# Patient Record
Sex: Male | Born: 1955
Health system: Southern US, Community
[De-identification: ages and names within clinical notes are randomized; demographics above are authoritative.]

## PROBLEM LIST (undated history)

## (undated) DIAGNOSIS — C61 Malignant neoplasm of prostate: Secondary | ICD-10-CM

## (undated) HISTORY — DX: Malignant neoplasm of prostate: C61

---

## 2001-03-23 ENCOUNTER — Ambulatory Visit (HOSPITAL_COMMUNITY): Admission: RE | Admit: 2001-03-23 | Discharge: 2001-03-23 | Payer: Self-pay | Admitting: Gastroenterology

## 2014-12-04 ENCOUNTER — Ambulatory Visit
Admission: RE | Admit: 2014-12-04 | Discharge: 2014-12-04 | Disposition: A | Discharge status: Home / Self Care | Payer: Self-pay | Source: Ambulatory Visit | Attending: *Deleted | Admitting: *Deleted

## 2014-12-04 ENCOUNTER — Other Ambulatory Visit: Payer: Self-pay | Admitting: *Deleted

## 2014-12-04 DIAGNOSIS — M545 Low back pain: Secondary | ICD-10-CM

## 2014-12-04 DIAGNOSIS — G8929 Other chronic pain: Secondary | ICD-10-CM

## 2014-12-04 DIAGNOSIS — M79605 Pain in left leg: Secondary | ICD-10-CM

## 2014-12-04 DIAGNOSIS — M533 Sacrococcygeal disorders, not elsewhere classified: Secondary | ICD-10-CM

## 2014-12-14 ENCOUNTER — Ambulatory Visit
Admission: RE | Admit: 2014-12-14 | Discharge: 2014-12-14 | Disposition: A | Discharge status: Home / Self Care | Payer: BLUE CROSS/BLUE SHIELD | Source: Ambulatory Visit | Attending: *Deleted | Admitting: *Deleted

## 2014-12-14 DIAGNOSIS — M79605 Pain in left leg: Secondary | ICD-10-CM

## 2014-12-14 DIAGNOSIS — M545 Low back pain: Secondary | ICD-10-CM

## 2014-12-14 DIAGNOSIS — G8929 Other chronic pain: Secondary | ICD-10-CM

## 2014-12-14 DIAGNOSIS — M533 Sacrococcygeal disorders, not elsewhere classified: Secondary | ICD-10-CM

## 2014-12-14 DIAGNOSIS — M79604 Pain in right leg: Secondary | ICD-10-CM

## 2014-12-14 MED ORDER — GADOBENATE DIMEGLUMINE 529 MG/ML IV SOLN
20.0000 mL | Freq: Once | INTRAVENOUS | Status: AC | PRN
  Administered 2014-12-14: 20 mL via INTRAVENOUS

## 2016-07-15 DIAGNOSIS — Z1211 Encounter for screening for malignant neoplasm of colon: Secondary | ICD-10-CM | POA: Diagnosis not present

## 2016-07-15 DIAGNOSIS — K219 Gastro-esophageal reflux disease without esophagitis: Secondary | ICD-10-CM | POA: Diagnosis not present

## 2016-08-13 DIAGNOSIS — K635 Polyp of colon: Secondary | ICD-10-CM | POA: Diagnosis not present

## 2016-08-13 DIAGNOSIS — K5730 Diverticulosis of large intestine without perforation or abscess without bleeding: Secondary | ICD-10-CM | POA: Diagnosis not present

## 2016-08-13 DIAGNOSIS — Z1211 Encounter for screening for malignant neoplasm of colon: Secondary | ICD-10-CM | POA: Diagnosis not present

## 2016-08-13 DIAGNOSIS — D125 Benign neoplasm of sigmoid colon: Secondary | ICD-10-CM | POA: Diagnosis not present

## 2016-10-20 ENCOUNTER — Other Ambulatory Visit: Payer: Self-pay | Admitting: Urology

## 2016-10-20 DIAGNOSIS — C61 Malignant neoplasm of prostate: Secondary | ICD-10-CM

## 2016-11-06 ENCOUNTER — Encounter (HOSPITAL_COMMUNITY)
Admission: RE | Admit: 2016-11-06 | Discharge: 2016-11-06 | Disposition: A | Discharge status: Home / Self Care | Payer: BLUE CROSS/BLUE SHIELD | Source: Ambulatory Visit | Attending: Urology | Admitting: Urology

## 2016-11-06 DIAGNOSIS — C61 Malignant neoplasm of prostate: Secondary | ICD-10-CM | POA: Insufficient documentation

## 2016-11-06 MED ORDER — AXUMIN (FLUCICLOVINE F 18) INJECTION
9.0200 | Freq: Once | INTRAVENOUS | Status: AC
  Administered 2016-11-06: 9.02 via INTRAVENOUS

## 2017-03-13 ENCOUNTER — Other Ambulatory Visit: Payer: Self-pay | Admitting: Otolaryngology

## 2017-03-13 DIAGNOSIS — J3800 Paralysis of vocal cords and larynx, unspecified: Secondary | ICD-10-CM

## 2017-03-13 DIAGNOSIS — H9041 Sensorineural hearing loss, unilateral, right ear, with unrestricted hearing on the contralateral side: Secondary | ICD-10-CM

## 2017-03-13 DIAGNOSIS — R490 Dysphonia: Secondary | ICD-10-CM

## 2017-03-13 DIAGNOSIS — C61 Malignant neoplasm of prostate: Secondary | ICD-10-CM

## 2017-08-11 ENCOUNTER — Ambulatory Visit
Admission: RE | Admit: 2017-08-11 | Discharge: 2017-08-11 | Disposition: A | Discharge status: Home / Self Care | Payer: BLUE CROSS/BLUE SHIELD | Source: Ambulatory Visit | Attending: Family Medicine | Admitting: Family Medicine

## 2017-08-11 ENCOUNTER — Other Ambulatory Visit: Payer: Self-pay | Admitting: Family Medicine

## 2017-08-11 DIAGNOSIS — R053 Chronic cough: Secondary | ICD-10-CM

## 2017-08-11 DIAGNOSIS — R05 Cough: Secondary | ICD-10-CM

## 2017-09-03 ENCOUNTER — Telehealth: Payer: Self-pay | Admitting: Pulmonary Disease

## 2017-09-03 ENCOUNTER — Ambulatory Visit: Payer: BLUE CROSS/BLUE SHIELD | Admitting: Pulmonary Disease

## 2017-09-03 ENCOUNTER — Encounter: Payer: Self-pay | Admitting: Pulmonary Disease

## 2017-09-03 VITALS — BP 142/78 | HR 74

## 2017-09-03 DIAGNOSIS — R05 Cough: Secondary | ICD-10-CM

## 2017-09-03 DIAGNOSIS — R059 Cough, unspecified: Secondary | ICD-10-CM

## 2017-09-03 LAB — NITRIC OXIDE: NITRIC OXIDE: 17

## 2017-09-03 MED ORDER — CHLORPHENIRAMINE MALEATE 4 MG PO TABS: 8.0000 mg | ORAL_TABLET | Freq: Three times a day (TID) | ORAL | 0 refills | Status: DC

## 2017-09-03 MED ORDER — AZELASTINE HCL 0.1 % NA SOLN: 1.0000 | Freq: Two times a day (BID) | NASAL | 6 refills | Status: DC

## 2017-09-03 MED ORDER — PANTOPRAZOLE SODIUM 40 MG PO TBEC: 40.0000 mg | DELAYED_RELEASE_TABLET | Freq: Every day | ORAL | 1 refills | Status: DC

## 2017-09-03 MED ORDER — FLUTICASONE PROPIONATE 50 MCG/ACT NA SUSP: 2.0000 | Freq: Every day | NASAL | 6 refills | Status: DC

## 2017-09-03 MED ORDER — AZELASTINE-FLUTICASONE 137-50 MCG/ACT NA SUSP: 2.0000 | Freq: Every day | NASAL | 5 refills | Status: DC

## 2017-09-03 NOTE — Telephone Encounter (Signed)
States patient gets meds by delivery and would like to have this done before lunch.

## 2017-09-03 NOTE — Progress Notes (Signed)
Matthew Rangel    [id number]    [birth year]  Primary Care Physician:Hamrick, Lorin Mercy, MD  Referring Physician: Leonides Sake, MD [address] [place], [place] [address]  Chief complaint: Consult for evaluation of cough  HPI: 61 year old with history of prostate cancer.  Here for evaluation of chronic cough.  He reports that the cough for more than therapy 6 months ago when he started [place] therapy for prostate cancer.  He also had a voice change at that time.  He was evaluated by Dr. Constance Holster, ENT who noted a new onset left vocal cord paralysis.  CT imaging of the neck was recommended but did not get done. He continues to have cough which is nonproductive in nature, occasional dyspnea with wheezing.  Denies any mucus production, fevers, chills. He has seasonal allergies with acid reflux.  He uses steroid nasal spray and Protonix as needed   Pets: None currently.  He used to have a dog. 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 Exposures: Not recall exposure to asbestos, mold or any other exposures Smoking history: Never smoker Travel History: Not significant  Outpatient Encounter Medications as of 09/03/2017  Medication Sig  . abiraterone acetate (ZYTIGA) 250 MG tablet Take 1000 mg (4 X 250mg  tablets) by mouth daily.  Take 1 hour before or 2 hours after meals.  Marland Kitchen aspirin EC 81 MG tablet Take 81 mg by mouth daily.  Marland Kitchen levothyroxine (SYNTHROID, LEVOTHROID) 125 MCG tablet Take by mouth.   No facility-administered encounter medications on file as of 09/03/2017.     Allergies as of 09/03/2017  . (Not on File)    Past Medical History:  Diagnosis Date  . Prostate cancer [facility])     History reviewed. No pertinent surgical history.  Family History  Problem Relation Age of Onset  . Breast cancer Mother   . Heart attack Father     Social History   Socioeconomic History  . Marital status: Married    Spouse name: Not on  file  . Number of children: Not on file  . Years of education: Not on file  . Highest education level: Not on file  Social Needs  . Financial resource strain: Not on file  . Food insecurity - worry: Not on file  . Food insecurity - inability: Not on file  . Transportation needs - medical: Not on file  . Transportation needs - non-medical: Not on file  Occupational History  . Not on file  Tobacco Use  . Smoking status: Never Smoker  . Smokeless tobacco: Never Used  Substance and Sexual Activity  . Alcohol use: Yes    Frequency: Never    Comment: occ  . Drug use: No  . Sexual activity: Not on file  Other Topics Concern  . Not on file  Social History Narrative  . Not on file    Review of systems: Review of Systems  Constitutional: Negative for fever and chills.  HENT: Negative.   Eyes: Negative for blurred vision.  Respiratory: as per HPI  Cardiovascular: Negative for chest pain and palpitations.  Gastrointestinal: Negative for vomiting, diarrhea, blood per rectum. Genitourinary: Negative for dysuria, urgency, frequency and hematuria.  Musculoskeletal: Negative for myalgias, back pain and joint pain.  Skin: Negative for itching and rash.  Neurological: Negative for dizziness, tremors, focal weakness, seizures and loss of consciousness.  Endo/Heme/Allergies: Negative for environmental allergies.  Psychiatric/Behavioral: Negative for  depression, suicidal ideas and hallucinations.  All other systems reviewed and are negative.  Physical Exam: Blood pressure (!) 142/78, pulse 74, SpO2 97 %. Gen:      No acute distress HEENT:  EOMI, sclera anicteric Neck:     No masses; no thyromegaly Lungs:    Clear to auscultation bilaterally; normal respiratory effort CV:         Regular rate and rhythm; no murmurs Abd:      + bowel sounds; soft, non-tender; no palpable masses, no distension Ext:    No edema; adequate peripheral perfusion Skin:      Warm and dry; no rash Neuro: alert  and oriented x 3 Psych: normal mood and affect  Data Reviewed: Chest x-ray 08/11/17-no active pulmonary disease. FENO 09/03/17-17  Assessment:  Evaluation for chronic cough Suspect upper airway cough from postnasal drip.  He may have a component of acid reflux which is contributing to ongoing cough.  Will start him on chlorpheniramine and Dymista nasal spray He has history of GERD but is using the PPI intermittently. Suggests that he use Protonix daily for treatment of acid reflux Suspicion for asthma is low.  We will schedule for pulmonary function test and give albuterol rescue inhaler  Previous ENT eval noted for vocal cord paralysis.  CT scan neck was recommended but not done yet We will refer him back to Dr. Constance Holster for reevaluation.  Plan/Recommendations: - Start chlorpheniramine, Dymista - Protonix for GERD - PFTs, albuterol rescue inhaler - Refer back to ENT.  Marshell Garfinkel MD [facility] Pager [phone] 09/03/2017, 10:00 AM  CC: Hamrick, Lorin Mercy, MD

## 2017-09-03 NOTE — Telephone Encounter (Signed)
Changed to astelin and flonase since the insurance wanted to change the pt to a generic.  Nothing further is needed.

## 2017-09-03 NOTE — Patient Instructions (Signed)
We will start you on chlorpheniramine antihistamine 8 mg 3 times daily and Dymista nasal spray Start using the Protonix every day for treatment of acid reflux We will schedule you for pulmonary function test and give an albuterol rescue inhaler We will refer you to Dr. Constance Holster for reevaluation of your cough Follow-up in 1-2 months.

## 2017-09-07 ENCOUNTER — Other Ambulatory Visit: Payer: Self-pay | Admitting: Urology

## 2017-09-07 DIAGNOSIS — Z79899 Other long term (current) drug therapy: Secondary | ICD-10-CM

## 2017-09-23 ENCOUNTER — Ambulatory Visit
Admission: RE | Admit: 2017-09-23 | Discharge: 2017-09-23 | Disposition: A | Discharge status: Home / Self Care | Payer: BLUE CROSS/BLUE SHIELD | Source: Ambulatory Visit | Attending: Urology | Admitting: Urology

## 2017-09-23 DIAGNOSIS — Z79899 Other long term (current) drug therapy: Secondary | ICD-10-CM

## 2017-10-15 ENCOUNTER — Ambulatory Visit (INDEPENDENT_AMBULATORY_CARE_PROVIDER_SITE_OTHER): Payer: BLUE CROSS/BLUE SHIELD | Admitting: Pulmonary Disease

## 2017-10-15 ENCOUNTER — Encounter: Payer: Self-pay | Admitting: Pulmonary Disease

## 2017-10-15 ENCOUNTER — Other Ambulatory Visit (INDEPENDENT_AMBULATORY_CARE_PROVIDER_SITE_OTHER): Payer: BLUE CROSS/BLUE SHIELD

## 2017-10-15 ENCOUNTER — Ambulatory Visit: Payer: BLUE CROSS/BLUE SHIELD | Admitting: Pulmonary Disease

## 2017-10-15 VITALS — BP 140/82 | HR 73 | Ht 73.0 in | Wt 255.0 lb

## 2017-10-15 DIAGNOSIS — R05 Cough: Secondary | ICD-10-CM | POA: Diagnosis not present

## 2017-10-15 DIAGNOSIS — J3800 Paralysis of vocal cords and larynx, unspecified: Secondary | ICD-10-CM

## 2017-10-15 DIAGNOSIS — R059 Cough, unspecified: Secondary | ICD-10-CM

## 2017-10-15 LAB — PULMONARY FUNCTION TEST
DL/VA % PRED: 114 %
DL/VA: 5.47 ml/min/mmHg/L
DLCO UNC % PRED: 94 %
DLCO cor % pred: 99 %
DLCO cor: 36.33 ml/min/mmHg
DLCO unc: 34.46 ml/min/mmHg
FEF 25-75 PRE: 2.84 L/s
FEF 25-75 Post: 3.2 L/sec
FEF2575-%CHANGE-POST: 12 %
FEF2575-%Pred-Post: 104 %
FEF2575-%Pred-Pre: 92 %
FEV1-%Change-Post: 3 %
FEV1-%PRED-PRE: 95 %
FEV1-%Pred-Post: 98 %
FEV1-PRE: 3.26 L
FEV1-Post: 3.38 L
FEV1FVC-%CHANGE-POST: 0 %
FEV1FVC-%Pred-Pre: 99 %
FEV6-%CHANGE-POST: 6 %
FEV6-%PRED-POST: 103 %
FEV6-%PRED-PRE: 97 %
FEV6-PRE: 4.14 L
FEV6-Post: 4.41 L
FEV6FVC-%Change-Post: 0 %
FEV6FVC-%PRED-PRE: 103 %
FEV6FVC-%Pred-Post: 104 %
FVC-%CHANGE-POST: 4 %
FVC-%Pred-Post: 99 %
FVC-%Pred-Pre: 95 %
FVC-Post: 4.41 L
FVC-Pre: 4.22 L
POST FEV6/FVC RATIO: 100 %
Post FEV1/FVC ratio: 77 %
Pre FEV1/FVC ratio: 77 %
Pre FEV6/FVC Ratio: 100 %
RV % PRED: 98 %
RV: 2.43 L
TLC % pred: 93 %
TLC: 7.14 L

## 2017-10-15 LAB — BASIC METABOLIC PANEL
BUN: 19 mg/dL (ref 6–23)
CALCIUM: 8.9 mg/dL (ref 8.4–10.5)
CO2: 29 meq/L (ref 19–32)
Chloride: 103 mEq/L (ref 96–112)
Creatinine, Ser: 1 mg/dL (ref 0.40–1.50)
GFR: 96.96 mL/min (ref >60.00)
GLUCOSE: 99 mg/dL (ref 70–99)
Potassium: 4 mEq/L (ref 3.5–5.1)
SODIUM: 138 meq/L (ref 135–145)

## 2017-10-15 MED ORDER — ALBUTEROL SULFATE HFA 108 (90 BASE) MCG/ACT IN AERS: 2.0000 | INHALATION_SPRAY | Freq: Four times a day (QID) | RESPIRATORY_TRACT | 5 refills | Status: DC | PRN

## 2017-10-15 NOTE — Progress Notes (Signed)
Matthew Rangel    [id number]    [birth year]  Primary Care Physician:Hamrick, Lorin Mercy, MD  Referring Physician: Leonides Sake, MD [place], [place] [address]  Chief complaint: Follow-up for chronic cough  HPI: 62 year old with history of prostate cancer.  Here for evaluation of chronic cough.  He reports that the cough for more than therapy 6 months ago when he started [place] therapy for prostate cancer.  He also had a voice change at that time.  He was evaluated by Dr. Constance Holster, ENT who noted a new onset left vocal cord paralysis.  CT imaging of the neck was recommended but did not get done. He continues to have cough which is nonproductive in nature, occasional dyspnea with wheezing.  Denies any mucus production, fevers, chills. He has seasonal allergies with acid reflux.  He uses steroid nasal spray and Protonix as needed   Pets: None currently.  He used to have a dog. 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 Exposures: Not recall exposure to asbestos, mold or any other exposures Smoking history: Never smoker Travel History: Not significant  Interim history: Continues to have nonproductive cough.  Has tried chlorpheniramine, Dymista nasal spray and Protonix with no improvement in symptoms He has PFTs today and is here for review. Still has not followed up with ENT even though he had an appointment. Reports that he has history of sleep apnea and has been prescribed CPAP but is not using it on a regular basis.  Outpatient Encounter Medications as of 10/15/2017  Medication Sig  . abiraterone acetate (ZYTIGA) 250 MG tablet Take 1000 mg (4 X 250mg  tablets) by mouth daily.  Take 1 hour before or 2 hours after meals.  Marland Kitchen aspirin EC 81 MG tablet Take 81 mg by mouth daily.  Marland Kitchen azelastine (ASTELIN) 0.1 % nasal spray Place 1 spray into both nostrils 2 (two) times daily. Use in each nostril as directed  .  Azelastine-Fluticasone (DYMISTA) 137-50 MCG/ACT SUSP Place 2 sprays into the nose daily.  . chlorpheniramine (CHLOR-TRIMETON) 4 MG tablet Take 2 tablets (8 mg total) by mouth 3 (three) times daily.  . fluticasone (FLONASE) 50 MCG/ACT nasal spray Place 2 sprays into both nostrils daily.  Marland Kitchen goserelin (ZOLADEX) 3.6 MG injection Inject 3.6 mg into the skin every 28 (twenty-eight) days.  Marland Kitchen levothyroxine (SYNTHROID, LEVOTHROID) 125 MCG tablet Take by mouth.  . pantoprazole (PROTONIX) 40 MG tablet Take 1 tablet (40 mg total) by mouth daily.   No facility-administered encounter medications on file as of 10/15/2017.     Allergies as of 10/15/2017  . (Not on File)    Past Medical History:  Diagnosis Date  . Prostate cancer ([facility])     No past surgical history on file.  Family History  Problem Relation Age of Onset  . Breast cancer Mother   . Heart attack Father     Social History   Socioeconomic History  . Marital status: Married    Spouse name: Not on file  . Number of children: Not on file  . Years of education: Not on file  . Highest education level: Not on file  Social Needs  . Financial resource strain: Not on file  . Food insecurity - worry: Not on file  . Food insecurity - inability: Not on file  . Transportation needs - medical: Not on file  . Transportation needs - non-medical: Not on  file  Occupational History  . Not on file  Tobacco Use  . Smoking status: Never Smoker  . Smokeless tobacco: Never Used  Substance and Sexual Activity  . Alcohol use: Yes    Frequency: Never    Comment: occ  . Drug use: No  . Sexual activity: Not on file  Other Topics Concern  . Not on file  Social History Narrative  . Not on file   Review of systems: Review of Systems  Constitutional: Negative for fever and chills.  HENT: Negative.   Eyes: Negative for blurred vision.  Respiratory: as per HPI  Cardiovascular: Negative for chest pain and palpitations.  Gastrointestinal:  Negative for vomiting, diarrhea, blood per rectum. Genitourinary: Negative for dysuria, urgency, frequency and hematuria.  Musculoskeletal: Negative for myalgias, back pain and joint pain.  Skin: Negative for itching and rash.  Neurological: Negative for dizziness, tremors, focal weakness, seizures and loss of consciousness.  Endo/Heme/Allergies: Negative for environmental allergies.  Psychiatric/Behavioral: Negative for depression, suicidal ideas and hallucinations.  All other systems reviewed and are negative.  Physical Exam: Blood pressure 140/82, pulse 73, height 6\' 1"  (1.854 m), weight 255 lb (115.7 kg), SpO2 97 %. Gen:      No acute distress HEENT:  EOMI, sclera anicteric Neck:     No masses; no thyromegaly Lungs:    Clear to auscultation bilaterally; normal respiratory effort CV:         Regular rate and rhythm; no murmurs Abd:      + bowel sounds; soft, non-tender; no palpable masses, no distension Ext:    No edema; adequate peripheral perfusion Skin:      Warm and dry; no rash Neuro: alert and oriented x 3 Psych: normal mood and affect  Data Reviewed: Chest x-ray 08/11/17-no active pulmonary disease. FENO 09/03/17-17  PFTs 10/15/17 FVC 4.41 [9 9%], FEV1 3.38 [98%], F/F 77, TLC 93%, DLCO 94% Normal test  Assessment:  Evaluation for chronic cough Suspect upper airway cough from postnasal drip.  He may have a component of acid reflux which is contributing to ongoing cough.  Currently on Chlorpheniramine, Dymista and PPI with persistent symptoms Previous ENT eval noted for vocal cord paralysis.  CT scan neck was recommended but not done yet We will refer him back to Dr. Constance Holster, ENT for reevaluation.  Suspicion for asthma is low but we will give him albuterol rescue inhaler and assess response to therapy. Order CT of the chest to make sure there is no lung involvement from his prostate cancer.   OSA Noncompliant with therapy.  Untreated OSA can contribute to ongoing  cough I have encouraged him to use his CPAP every day.  Plan/Recommendations: - Continue chlorpheniramine, Dymista, Protonix - Albuterol rescue inhaler - Refer back to ENT. - CT chest.   Marshell Garfinkel MD [facility] Pager [phone] 10/15/2017, 12:27 PM  CC: Hamrick, Lorin Mercy, MD

## 2017-10-15 NOTE — Patient Instructions (Addendum)
We will order a CT neck/thyroid with contrast as per recommendation of ENT doctor I will add a CT of the chest to make sure there is no lung abnormality Will refer you back to Dr. Constance Holster, ENT for evaluation We will give you albuterol inhaler to be used as needed Please start using your CPAP on a regular basis Follow-up in 3 months.

## 2017-10-15 NOTE — Addendum Note (Signed)
Addended by: Parke Poisson E on: 10/15/2017 12:56 PM   Modules accepted: Orders

## 2017-10-15 NOTE — Progress Notes (Signed)
Full PFT completed today 10/15/17

## 2017-10-21 ENCOUNTER — Other Ambulatory Visit: Payer: Self-pay

## 2017-10-21 DIAGNOSIS — R059 Cough, unspecified: Secondary | ICD-10-CM

## 2017-10-21 DIAGNOSIS — R05 Cough: Secondary | ICD-10-CM

## 2017-10-22 ENCOUNTER — Ambulatory Visit (INDEPENDENT_AMBULATORY_CARE_PROVIDER_SITE_OTHER)
Admission: RE | Admit: 2017-10-22 | Discharge: 2017-10-22 | Disposition: A | Discharge status: Home / Self Care | Payer: BLUE CROSS/BLUE SHIELD | Source: Ambulatory Visit | Attending: Pulmonary Disease | Admitting: Pulmonary Disease

## 2017-10-22 DIAGNOSIS — R059 Cough, unspecified: Secondary | ICD-10-CM

## 2017-10-22 DIAGNOSIS — J3800 Paralysis of vocal cords and larynx, unspecified: Secondary | ICD-10-CM

## 2017-10-22 DIAGNOSIS — R05 Cough: Secondary | ICD-10-CM

## 2017-10-22 MED ORDER — IOPAMIDOL (ISOVUE-300) INJECTION 61%
80.0000 mL | Freq: Once | INTRAVENOUS | Status: AC | PRN
  Administered 2017-10-22: 80 mL via INTRAVENOUS

## 2018-02-12 ENCOUNTER — Other Ambulatory Visit: Payer: Self-pay | Admitting: Urology

## 2018-02-12 DIAGNOSIS — C61 Malignant neoplasm of prostate: Secondary | ICD-10-CM

## 2018-02-17 ENCOUNTER — Telehealth: Payer: Self-pay | Admitting: Oncology

## 2018-02-17 ENCOUNTER — Encounter: Payer: Self-pay | Admitting: Oncology

## 2018-02-17 NOTE — Telephone Encounter (Signed)
Referral received from Dr. Jeffie Pollock at [facility] for a dx of castrate resistant prostate cancer. Pt has been scheduled to see Dr. Alen Blew on 6/7 at 2pm. Pt aware to arrive 30 minutes early. Letter mailed to the pt and faxed to the referring office.

## 2018-02-24 ENCOUNTER — Encounter (HOSPITAL_COMMUNITY)
Admission: RE | Admit: 2018-02-24 | Discharge: 2018-02-24 | Disposition: A | Discharge status: Home / Self Care | Payer: BLUE CROSS/BLUE SHIELD | Source: Ambulatory Visit | Attending: Urology | Admitting: Urology

## 2018-02-24 DIAGNOSIS — C61 Malignant neoplasm of prostate: Secondary | ICD-10-CM | POA: Insufficient documentation

## 2018-02-24 DIAGNOSIS — M899 Disorder of bone, unspecified: Secondary | ICD-10-CM | POA: Diagnosis not present

## 2018-02-24 MED ORDER — TECHNETIUM TC 99M MEDRONATE IV KIT
20.6000 | PACK | Freq: Once | INTRAVENOUS | Status: AC | PRN
  Administered 2018-02-24: 20.6 via INTRAVENOUS

## 2018-02-26 ENCOUNTER — Inpatient Hospital Stay: Payer: BLUE CROSS/BLUE SHIELD | Attending: Oncology | Admitting: Oncology

## 2018-02-26 ENCOUNTER — Telehealth: Payer: Self-pay | Admitting: Oncology

## 2018-02-26 VITALS — BP 145/89 | HR 68 | Temp 98.4°F | Resp 17 | Ht 73.0 in | Wt 253.6 lb

## 2018-02-26 DIAGNOSIS — C7951 Secondary malignant neoplasm of bone: Secondary | ICD-10-CM | POA: Diagnosis not present

## 2018-02-26 DIAGNOSIS — C61 Malignant neoplasm of prostate: Secondary | ICD-10-CM | POA: Insufficient documentation

## 2018-02-26 DIAGNOSIS — E291 Testicular hypofunction: Secondary | ICD-10-CM | POA: Diagnosis not present

## 2018-02-26 NOTE — Telephone Encounter (Signed)
Appointments scheduled AVS/Calendar printed per 6/7 los

## 2018-02-26 NOTE — Progress Notes (Signed)
Reason for Referral: Prostate cancer  HPI: 62 year old gentleman native of [place] but currently resides in this area and have done so for an extended period of time.  He has a history of prostate cancer diagnosed in 2015.  At that time his PSA was 8.42 and a biopsy performed at that time showed a Gleason score of 3+4 = 7 on March 08, 2014.  He received Lupron for short period of time before proceeding with cryoablation in 2016.  His PSA did not respond with a rise up to 22.1 in January 2018.  Axumin scan in February 2018 showed periaortic lymph node involvement at that time.  He subsequently started on Zoladex which she has been receiving under the care of Dr. Jeffie Pollock since that time.  His a PSA in January 2018 was 18.1 and decline to a nadir of 8.1 in October 2018.  His PSA was up to 9.9 in January 2019 and up to 21 in May 2019.  He was late on his recent [place] injection.  He remains a relatively asymptomatic from his prostate cancer but does report some complications related to [facility] including hot flashes, weight gain and fatigue.  He does report urinary frequency and urgency and nocturia.  He denies any bone pain or pathological fractures.  His most recent staging work-up completed in June 2019 including a bone scan on February 24, 2018 which showed abnormal uptake in the posterior left 10th rib as well as the medial right ninth rib corresponding sclerotic lesions and CT which is consistent with metastatic disease.  His CT scan of the abdomen and pelvis did show periaortic lymphadenopathy measuring 2.8 cm.  He does not report any headaches, blurry vision, syncope or seizures. Does not report any fevers, chills but does report sweats at times. Does not report any cough, wheezing or hemoptysis.  Does not report any chest pain, palpitation, orthopnea or leg edema.  Does not report any nausea, vomiting or abdominal pain.  Does not report any constipation or diarrhea.  Does not report any skeletal  complaints.    Does not report hematuria.  Does not report any skin rashes or lesions. Does not report any heat or cold intolerance.  Does not report any lymphadenopathy or petechiae.  Does not report any anxiety or depression.  Remaining review of systems is negative.    Past Medical History:  Diagnosis Date  . Prostate cancer ([place])   :  No past surgical history on file.:   Current Outpatient Medications:  .  aspirin EC 81 MG tablet, Take 81 mg by mouth daily., Disp: , Rfl:  .  goserelin (ZOLADEX) 3.6 MG injection, Inject 3.6 mg into the skin every 28 (twenty-eight) days., Disp: , Rfl:  .  levothyroxine (SYNTHROID, LEVOTHROID) 125 MCG tablet, Take by mouth., Disp: , Rfl: :  No Known Allergies:  Family History  Problem Relation Age of Onset  . Breast cancer Mother   . Heart attack Father   :  Social History   Socioeconomic History  . Marital status: Married    Spouse name: Not on file  . Number of children: Not on file  . Years of education: Not on file  . Highest education level: Not on file  Occupational History  . Not on file  Social Needs  . Financial resource strain: Not on file  . Food insecurity:    Worry: Not on file    Inability: Not on file  . Transportation needs:    Medical: Not  on file    Non-medical: Not on file  Tobacco Use  . Smoking status: Never Smoker  . Smokeless tobacco: Never Used  Substance and Sexual Activity  . Alcohol use: Yes    Frequency: Never    Comment: occ  . Drug use: No  . Sexual activity: Not on file  Lifestyle  . Physical activity:    Days per week: Not on file    Minutes per session: Not on file  . Stress: Not on file  Relationships  . Social connections:    Talks on phone: Not on file    Gets together: Not on file    Attends religious service: Not on file    Active member of club or organization: Not on file    Attends meetings of clubs or organizations: Not on file    Relationship status: Not on file  . Intimate  partner violence:    Fear of current or ex partner: Not on file    Emotionally abused: Not on file    Physically abused: Not on file    Forced sexual activity: Not on file  Other Topics Concern  . Not on file  Social History Narrative  . Not on file  :  Pertinent items are noted in HPI.  Exam: Blood pressure (!) 145/89, pulse 68, temperature 98.4 F (36.9 C), temperature source Oral, resp. rate 17, height 6\' 1"  (1.854 m), weight 253 lb 9.6 oz (115 kg), SpO2 99 %.  ECOG 0 General appearance: alert and cooperative appeared without distress. Head: atraumatic without any abnormalities. Eyes: conjunctivae/corneas clear. PERRL.  Sclera anicteric. Throat: lips, mucosa, and tongue normal; without oral thrush or ulcers. Resp: clear to auscultation bilaterally without rhonchi, wheezes or dullness to percussion. Cardio: regular rate and rhythm, S1, S2 normal, no murmur, click, rub or gallop GI: soft, non-tender; bowel sounds normal; no masses,  no organomegaly Skin: Skin color, texture, turgor normal. No rashes or lesions Lymph nodes: Cervical, supraclavicular, and axillary nodes normal. Neurologic: Grossly normal without any motor, sensory or deep tendon reflexes. Musculoskeletal: No joint deformity or effusion.     Nm Bone Scan Whole Body  Result Date: 02/24/2018 CLINICAL DATA:  Prostate cancer, PSA 21.2 EXAM: NUCLEAR MEDICINE WHOLE BODY BONE SCAN TECHNIQUE: Whole body anterior and posterior images were obtained approximately 3 hours after intravenous injection of radiopharmaceutical. RADIOPHARMACEUTICALS:  20.6 mCi Technetium-14m MDP IV COMPARISON:  None Radiographic correlation: CT abdomen and pelvis 02/24/2018 FINDINGS: Foci of abnormal osseous tracer accumulation are identified at a posterior lower LEFT rib approximately tenth and at the RIGHT T9 costovertebral junction. On CT, a sclerotic focus is identified at the medial aspect of the RIGHT ninth rib corresponding to the scintigraphic  finding consistent with osseous metastasis. On CT, a vague sclerotic focus is seen within the posterior LEFT ninth rib consistent with osseous metastasis. Questionable increased tracer localization at posterior LEFT sixth rib. Uptake at the shoulders and knees, typically degenerative. No additional sites of abnormal osseous tracer accumulation are identified. Specifically, no abnormal tracer localization is seen at sclerotic foci in the L4 vertebral body as noted on CT. Expected urinary tract and soft tissue distribution of tracer. IMPRESSION: Foci of abnormal increased tracer localization at the posterior LEFT tenth rib and at the medial RIGHT ninth rib corresponding to sclerotic lesions on CT consistent with osseous metastases. No definite scintigraphic abnormalities identified at small sclerotic lesions identified by CT at L4 vertebral body. Electronically Signed   By: Crist Infante.D.  On: 02/24/2018 17:04    Assessment and Plan:   62 year old gentleman with the following issues:  1.  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  He was diagnosed in 2015 with a Gleason score 3+4 = 7 and a PSA of 8.42.  He received cryo therapy in 2016 and developed advanced disease in 2018.  He was on androgen deprivation the form of Zoladex with a PSA decline from 18.10 to a nadir of 8.1 in October 2018.  His PSA in January 2019 was 9.9 and in May 2019 was 21.  His staging work-up including a bone scan which showed limited sclerotic bony metastasis including bilateral ribs as well as pelvic adenopathy including 2.8 retroperitoneal adenopathy.  The natural course of this disease was reviewed today with the patient in detail including different treatment options.  He has an incurable malignancy disease that certainly can be treated and palliated for an extended period of time.  It is worrisome that he had developed castration resistant disease rather quickly which could suggest  more of an aggressive histology versus issues with compliance associated with his [place].  His last testosterone level has been castrate which indicate adequate treatment at this time.  Options of therapy were reviewed again in detail which include second line hormone therapy in the form of Zytiga or Xtandi, systemic chemotherapy, Xofigo or Provenge immunotherapy.  The rationale and risks and benefits of all these approaches were reviewed today.  The systemic chemotherapy is unnecessary at this time given his low volume disease and he is asymptomatic.  Rationale for using prevention immunotherapy was also discussed but I fear the rapid progression of his disease might not be altered by immunotherapy at this time.  The cumbersome nature of prevention immunotherapy was also unappealing.  Have given him written information about Zytiga and Gillermina Phy including discussion of the risks and side effects associated with both drugs.  These risks include nausea, fatigue, edema, hypokalemia as well as rarely seizures with Xtandi.  I urged him to start on treatment in the immediate future before his disease progresses more rapidly and he will let me know in the immediate future.  2.  Androgen deprivation: The rationale of continuing this therapy indefinitely was reviewed today in detail.  I have recommended continuing Zoladex at this time every 3 months as he is receiving.  3.  Follow-up: We will be in the next few months to follow his progress on this therapy.  60  minutes was spent with the patient face-to-face today.  More than 50% of time was dedicated to patient counseling, education and coordination of his care.

## 2018-03-17 ENCOUNTER — Telehealth: Payer: Self-pay | Admitting: Oncology

## 2018-03-17 NOTE — Telephone Encounter (Signed)
Faxed medical records to [facility] @ [phone]. Release ID# [id number]

## 2018-05-11 ENCOUNTER — Telehealth: Payer: Self-pay | Admitting: Oncology

## 2018-05-11 NOTE — Telephone Encounter (Signed)
Patient called to cancel did not want to reschedule °

## 2018-05-13 ENCOUNTER — Other Ambulatory Visit: Payer: BLUE CROSS/BLUE SHIELD

## 2018-05-13 ENCOUNTER — Ambulatory Visit: Payer: BLUE CROSS/BLUE SHIELD | Admitting: Oncology

## 2018-05-13 ENCOUNTER — Ambulatory Visit: Payer: BLUE CROSS/BLUE SHIELD

## 2018-08-04 DIAGNOSIS — R399 Unspecified symptoms and signs involving the genitourinary system: Secondary | ICD-10-CM | POA: Diagnosis not present

## 2018-08-04 DIAGNOSIS — C61 Malignant neoplasm of prostate: Secondary | ICD-10-CM | POA: Diagnosis not present

## 2018-08-31 DIAGNOSIS — C61 Malignant neoplasm of prostate: Secondary | ICD-10-CM | POA: Diagnosis not present

## 2018-09-04 ENCOUNTER — Emergency Department (HOSPITAL_BASED_OUTPATIENT_CLINIC_OR_DEPARTMENT_OTHER)
Admission: EM | Admit: 2018-09-04 | Discharge: 2018-09-04 | Disposition: A | Discharge status: Home / Self Care | Payer: 59 | Attending: Emergency Medicine | Admitting: Emergency Medicine

## 2018-09-04 ENCOUNTER — Encounter (HOSPITAL_BASED_OUTPATIENT_CLINIC_OR_DEPARTMENT_OTHER): Payer: Self-pay | Admitting: Emergency Medicine

## 2018-09-04 ENCOUNTER — Other Ambulatory Visit: Payer: Self-pay

## 2018-09-04 DIAGNOSIS — Z7982 Long term (current) use of aspirin: Secondary | ICD-10-CM | POA: Diagnosis not present

## 2018-09-04 DIAGNOSIS — R338 Other retention of urine: Secondary | ICD-10-CM

## 2018-09-04 DIAGNOSIS — Z8546 Personal history of malignant neoplasm of prostate: Secondary | ICD-10-CM | POA: Diagnosis not present

## 2018-09-04 DIAGNOSIS — R339 Retention of urine, unspecified: Secondary | ICD-10-CM | POA: Insufficient documentation

## 2018-09-04 LAB — URINALYSIS, ROUTINE W REFLEX MICROSCOPIC
BILIRUBIN URINE: NEGATIVE
GLUCOSE, UA: NEGATIVE mg/dL
KETONES UR: NEGATIVE mg/dL
LEUKOCYTES UA: NEGATIVE
Nitrite: NEGATIVE
PH: 5.5 (ref 5.0–8.0)
Protein, ur: NEGATIVE mg/dL
Specific Gravity, Urine: 1.025 (ref 1.005–1.030)

## 2018-09-04 LAB — URINALYSIS, MICROSCOPIC (REFLEX)

## 2018-09-04 MED ORDER — LIDOCAINE HCL URETHRAL/MUCOSAL 2 % EX GEL
CUTANEOUS | Status: AC
  Administered 2018-09-04: 06:00:00
  Filled 2018-09-04: qty 20

## 2018-09-04 NOTE — ED Provider Notes (Signed)
[facility] Provider Note: Georgena Spurling, MD, FACEP  CSN: [id number] MRN: [id number] ARRIVAL: 09/04/18 at [place]: [place]  Urinary Retention   HISTORY OF PRESENT ILLNESS  09/04/18 5:38 AM Matthew Rangel is a 62 y.o. male with a history of prostate cancer and urinary retention.  Yesterday he was urinating frequently and voiding small amounts.  Overnight he was just dribbling and this morning he found himself unable to urinate.  He is having severe pain in his bladder and cannot void his bladder.  He arrives hyperventilating due to the discomfort.    Past Medical History:  Diagnosis Date  . Prostate cancer [facility])     History reviewed. No pertinent surgical history.  Family History  Problem Relation Age of Onset  . Breast cancer Mother   . Heart attack Father     Social History   Tobacco Use  . Smoking status: Never Smoker  . Smokeless tobacco: Never Used  Substance Use Topics  . Alcohol use: Yes    Frequency: Never    Comment: occ  . Drug use: No    Prior to Admission medications   Medication Sig Start Date End Date Taking? Authorizing Provider  aspirin EC 81 MG tablet Take 81 mg by mouth daily.    [provider]  goserelin (ZOLADEX) 3.6 MG injection Inject 3.6 mg into the skin every 28 (twenty-eight) days.    [provider]  levothyroxine (SYNTHROID, LEVOTHROID) 125 MCG tablet Take by mouth. 12/04/14   [provider]    Allergies Patient has no known allergies.   REVIEW OF SYSTEMS  Negative except as noted here or in the History of Present Illness.   PHYSICAL EXAMINATION  Initial Vital Signs Blood pressure (!) 182/97, pulse 90, temperature 98.3 F (36.8 C), temperature source Oral, resp. rate (!) 22, height 6\' 4"  (1.93 m), weight 111.1 kg, SpO2 100 %.  Examination General: Well-developed, well-nourished male in no acute distress; appearance consistent with age of record HENT: normocephalic;  atraumatic Eyes: pupils equal, round and reactive to light; extraocular muscles intact Neck: supple Heart: regular rate and rhythm Lungs: clear to auscultation bilaterally; hyperventilating Abdomen: soft; distended, tender bladder; bowel sounds present GU: Tanner V male, circumcised Extremities: No deformity; full range of motion; pulses normal Neurologic: Awake, alert and oriented; motor function intact in all extremities and symmetric; no facial droop Skin: Warm and dry Psychiatric: Anxious   RESULTS  Summary of this visit's results, reviewed by myself:   EKG Interpretation  Date/Time:    Ventricular Rate:    PR Interval:    QRS Duration:   QT Interval:    QTC Calculation:   R Axis:     Text Interpretation:        Laboratory Studies: Results for orders placed or performed during the hospital encounter of 09/04/18 (from the past 24 hour(s))  Urinalysis, Routine w reflex microscopic     Status: Abnormal   Collection Time: 09/04/18  5:48 AM  Result Value Ref Range   Color, Urine YELLOW YELLOW   APPearance CLEAR CLEAR   Specific Gravity, Urine 1.025 1.005 - 1.030   pH 5.5 5.0 - 8.0   Glucose, UA NEGATIVE NEGATIVE mg/dL   Hgb urine dipstick SMALL (A) NEGATIVE   Bilirubin Urine NEGATIVE NEGATIVE   Ketones, ur NEGATIVE NEGATIVE mg/dL   Protein, ur NEGATIVE NEGATIVE mg/dL   Nitrite NEGATIVE NEGATIVE   Leukocytes, UA NEGATIVE NEGATIVE  Urinalysis, Microscopic (reflex)  Status: Abnormal   Collection Time: 09/04/18  5:48 AM  Result Value Ref Range   RBC / HPF 21-50 0 - 5 RBC/hpf   WBC, UA 0-5 0 - 5 WBC/hpf   Bacteria, UA FEW (A) NONE SEEN   Squamous Epithelial / LPF 0-5 0 - 5   Imaging Studies: No results found.  ED COURSE and MDM  Nursing notes and initial vitals signs, including pulse oximetry, reviewed.  Vitals:   09/04/18 0534 09/04/18 0535  BP:  (!) 182/97  Pulse:  90  Resp:  (!) 22  Temp:  98.3 F (36.8 C)  TempSrc:  Oral  SpO2:  100%  Weight: 111.1  kg   Height: 6\' 4"  (1.93 m)    5:48 AM Bedside bladder scan showed greater than 300 mL in the bladder.  Coud catheter placed by nursing staff, so far about 700 mL urine output.  Patient's discomfort improved.  Patient has a urologist and an oncologist with whom he can follow-up.  PROCEDURES    ED DIAGNOSES     ICD-10-CM   1. Acute urinary retention R33.8        Shanon Rosser, MD 09/04/18 [phone]

## 2018-09-04 NOTE — ED Notes (Signed)
Bladder scanner completed showing >300.

## 2018-09-04 NOTE — ED Triage Notes (Signed)
Pt states is not able to urinate, he states he just have few drops every 2 min.

## 2018-09-07 DIAGNOSIS — C61 Malignant neoplasm of prostate: Secondary | ICD-10-CM | POA: Diagnosis not present

## 2018-09-07 DIAGNOSIS — Z960 Presence of urogenital implants: Secondary | ICD-10-CM | POA: Diagnosis not present

## 2018-09-07 DIAGNOSIS — R82998 Other abnormal findings in urine: Secondary | ICD-10-CM | POA: Diagnosis not present

## 2018-09-07 DIAGNOSIS — R339 Retention of urine, unspecified: Secondary | ICD-10-CM | POA: Diagnosis not present

## 2018-09-20 DIAGNOSIS — R9721 Rising PSA following treatment for malignant neoplasm of prostate: Secondary | ICD-10-CM | POA: Diagnosis not present

## 2018-09-20 DIAGNOSIS — K5900 Constipation, unspecified: Secondary | ICD-10-CM | POA: Diagnosis not present

## 2018-09-20 DIAGNOSIS — R399 Unspecified symptoms and signs involving the genitourinary system: Secondary | ICD-10-CM | POA: Diagnosis not present

## 2018-09-20 DIAGNOSIS — Z466 Encounter for fitting and adjustment of urinary device: Secondary | ICD-10-CM | POA: Diagnosis not present

## 2018-09-20 DIAGNOSIS — C61 Malignant neoplasm of prostate: Secondary | ICD-10-CM | POA: Diagnosis not present

## 2018-09-20 DIAGNOSIS — R82998 Other abnormal findings in urine: Secondary | ICD-10-CM | POA: Diagnosis not present

## 2018-09-20 DIAGNOSIS — R339 Retention of urine, unspecified: Secondary | ICD-10-CM | POA: Diagnosis not present

## 2018-10-01 DIAGNOSIS — R339 Retention of urine, unspecified: Secondary | ICD-10-CM | POA: Diagnosis not present

## 2018-10-01 DIAGNOSIS — C61 Malignant neoplasm of prostate: Secondary | ICD-10-CM | POA: Diagnosis not present

## 2018-10-01 DIAGNOSIS — R590 Localized enlarged lymph nodes: Secondary | ICD-10-CM | POA: Diagnosis not present

## 2018-10-01 DIAGNOSIS — N1339 Other hydronephrosis: Secondary | ICD-10-CM | POA: Diagnosis not present

## 2018-10-01 DIAGNOSIS — M899 Disorder of bone, unspecified: Secondary | ICD-10-CM | POA: Diagnosis not present

## 2018-10-01 DIAGNOSIS — C7951 Secondary malignant neoplasm of bone: Secondary | ICD-10-CM | POA: Diagnosis not present

## 2018-10-14 DIAGNOSIS — Z531 Procedure and treatment not carried out because of patient's decision for reasons of belief and group pressure: Secondary | ICD-10-CM | POA: Diagnosis not present

## 2018-10-14 DIAGNOSIS — N1330 Unspecified hydronephrosis: Secondary | ICD-10-CM | POA: Diagnosis not present

## 2018-10-14 DIAGNOSIS — R790 Abnormal level of blood mineral: Secondary | ICD-10-CM | POA: Diagnosis not present

## 2018-10-14 DIAGNOSIS — C7951 Secondary malignant neoplasm of bone: Secondary | ICD-10-CM | POA: Diagnosis not present

## 2018-10-14 DIAGNOSIS — C61 Malignant neoplasm of prostate: Secondary | ICD-10-CM | POA: Diagnosis not present

## 2018-10-21 DIAGNOSIS — C61 Malignant neoplasm of prostate: Secondary | ICD-10-CM | POA: Diagnosis not present

## 2018-10-21 DIAGNOSIS — R3989 Other symptoms and signs involving the genitourinary system: Secondary | ICD-10-CM | POA: Diagnosis not present

## 2018-10-21 DIAGNOSIS — R319 Hematuria, unspecified: Secondary | ICD-10-CM | POA: Diagnosis not present

## 2018-10-21 DIAGNOSIS — N390 Urinary tract infection, site not specified: Secondary | ICD-10-CM | POA: Diagnosis not present

## 2018-10-21 DIAGNOSIS — R399 Unspecified symptoms and signs involving the genitourinary system: Secondary | ICD-10-CM | POA: Diagnosis not present

## 2018-11-04 DIAGNOSIS — E039 Hypothyroidism, unspecified: Secondary | ICD-10-CM | POA: Diagnosis not present

## 2018-11-04 DIAGNOSIS — N1330 Unspecified hydronephrosis: Secondary | ICD-10-CM | POA: Diagnosis not present

## 2018-11-04 DIAGNOSIS — Z79818 Long term (current) use of other agents affecting estrogen receptors and estrogen levels: Secondary | ICD-10-CM | POA: Diagnosis not present

## 2018-11-04 DIAGNOSIS — R799 Abnormal finding of blood chemistry, unspecified: Secondary | ICD-10-CM | POA: Diagnosis not present

## 2018-11-04 DIAGNOSIS — C7951 Secondary malignant neoplasm of bone: Secondary | ICD-10-CM | POA: Diagnosis not present

## 2018-11-04 DIAGNOSIS — Z531 Procedure and treatment not carried out because of patient's decision for reasons of belief and group pressure: Secondary | ICD-10-CM | POA: Diagnosis not present

## 2018-11-04 DIAGNOSIS — N139 Obstructive and reflux uropathy, unspecified: Secondary | ICD-10-CM | POA: Diagnosis not present

## 2018-11-04 DIAGNOSIS — C61 Malignant neoplasm of prostate: Secondary | ICD-10-CM | POA: Diagnosis not present

## 2018-11-04 DIAGNOSIS — G4733 Obstructive sleep apnea (adult) (pediatric): Secondary | ICD-10-CM | POA: Diagnosis not present

## 2018-11-04 DIAGNOSIS — R790 Abnormal level of blood mineral: Secondary | ICD-10-CM | POA: Diagnosis not present

## 2018-11-05 MED FILL — BICALUTAMIDE 50 MG TABS: 50 | 14 days supply | Qty: 14 | Fill #0

## 2018-11-05 MED FILL — LEVOTHYROXINE 125 MCG TABLE: 125 | 30 days supply | Qty: 30 | Fill #0

## 2018-11-12 DIAGNOSIS — N1330 Unspecified hydronephrosis: Secondary | ICD-10-CM | POA: Diagnosis not present

## 2018-11-12 DIAGNOSIS — C61 Malignant neoplasm of prostate: Secondary | ICD-10-CM | POA: Diagnosis not present

## 2018-11-12 DIAGNOSIS — N139 Obstructive and reflux uropathy, unspecified: Secondary | ICD-10-CM | POA: Diagnosis not present

## 2018-11-15 DIAGNOSIS — Z936 Other artificial openings of urinary tract status: Secondary | ICD-10-CM | POA: Diagnosis not present

## 2018-11-15 DIAGNOSIS — C61 Malignant neoplasm of prostate: Secondary | ICD-10-CM | POA: Diagnosis not present

## 2018-11-15 DIAGNOSIS — N139 Obstructive and reflux uropathy, unspecified: Secondary | ICD-10-CM | POA: Diagnosis not present

## 2018-11-15 DIAGNOSIS — K5900 Constipation, unspecified: Secondary | ICD-10-CM | POA: Diagnosis not present

## 2018-11-15 DIAGNOSIS — N131 Hydronephrosis with ureteral stricture, not elsewhere classified: Secondary | ICD-10-CM | POA: Diagnosis not present

## 2018-11-15 DIAGNOSIS — Z79899 Other long term (current) drug therapy: Secondary | ICD-10-CM | POA: Diagnosis not present

## 2018-11-15 DIAGNOSIS — E039 Hypothyroidism, unspecified: Secondary | ICD-10-CM | POA: Diagnosis not present

## 2018-11-15 DIAGNOSIS — Z9889 Other specified postprocedural states: Secondary | ICD-10-CM | POA: Diagnosis not present

## 2018-11-15 DIAGNOSIS — N1339 Other hydronephrosis: Secondary | ICD-10-CM | POA: Diagnosis not present

## 2018-11-15 DIAGNOSIS — N1330 Unspecified hydronephrosis: Secondary | ICD-10-CM | POA: Diagnosis not present

## 2018-11-16 DIAGNOSIS — K5900 Constipation, unspecified: Secondary | ICD-10-CM | POA: Diagnosis not present

## 2018-11-16 DIAGNOSIS — Z936 Other artificial openings of urinary tract status: Secondary | ICD-10-CM | POA: Diagnosis not present

## 2018-11-16 DIAGNOSIS — Z79899 Other long term (current) drug therapy: Secondary | ICD-10-CM | POA: Diagnosis not present

## 2018-11-16 DIAGNOSIS — E039 Hypothyroidism, unspecified: Secondary | ICD-10-CM | POA: Diagnosis not present

## 2018-11-16 DIAGNOSIS — N131 Hydronephrosis with ureteral stricture, not elsewhere classified: Secondary | ICD-10-CM | POA: Diagnosis not present

## 2018-11-16 DIAGNOSIS — N139 Obstructive and reflux uropathy, unspecified: Secondary | ICD-10-CM | POA: Diagnosis not present

## 2018-11-16 DIAGNOSIS — C61 Malignant neoplasm of prostate: Secondary | ICD-10-CM | POA: Diagnosis not present

## 2018-11-16 DIAGNOSIS — N1330 Unspecified hydronephrosis: Secondary | ICD-10-CM | POA: Diagnosis not present

## 2018-11-16 DIAGNOSIS — Z9889 Other specified postprocedural states: Secondary | ICD-10-CM | POA: Diagnosis not present

## 2018-12-07 DIAGNOSIS — Z936 Other artificial openings of urinary tract status: Secondary | ICD-10-CM | POA: Diagnosis not present

## 2018-12-07 DIAGNOSIS — C61 Malignant neoplasm of prostate: Secondary | ICD-10-CM | POA: Diagnosis not present

## 2018-12-07 DIAGNOSIS — C7951 Secondary malignant neoplasm of bone: Secondary | ICD-10-CM | POA: Diagnosis not present

## 2018-12-07 DIAGNOSIS — Z192 Hormone resistant malignancy status: Secondary | ICD-10-CM | POA: Diagnosis not present

## 2018-12-07 DIAGNOSIS — D509 Iron deficiency anemia, unspecified: Secondary | ICD-10-CM | POA: Diagnosis not present

## 2018-12-07 MED FILL — LEVOTHYROXINE SODIUM 125 MC: 125 | 90 days supply | Qty: 90 | Fill #0

## 2018-12-07 MED FILL — ONDANSETRON HCL 4 MG TABLET: 4 | 10 days supply | Qty: 30 | Fill #0

## 2018-12-14 ENCOUNTER — Telehealth: Payer: Self-pay | Admitting: Oncology

## 2018-12-14 NOTE — Telephone Encounter (Signed)
Pt's sister has cld to have her brother's care transferred from [place] has been scheduled to see Dr. Alen Blew on 4/7 at 11am.

## 2018-12-22 ENCOUNTER — Telehealth: Payer: Self-pay | Admitting: Oncology

## 2018-12-22 ENCOUNTER — Other Ambulatory Visit: Payer: Self-pay

## 2018-12-22 ENCOUNTER — Telehealth: Payer: Self-pay

## 2018-12-22 ENCOUNTER — Inpatient Hospital Stay: Payer: 59 | Attending: Oncology | Admitting: Oncology

## 2018-12-22 VITALS — BP 126/82 | HR 87 | Temp 97.7°F | Resp 18 | Ht 76.0 in | Wt 203.3 lb

## 2018-12-22 DIAGNOSIS — F419 Anxiety disorder, unspecified: Secondary | ICD-10-CM | POA: Diagnosis not present

## 2018-12-22 DIAGNOSIS — C7951 Secondary malignant neoplasm of bone: Secondary | ICD-10-CM | POA: Diagnosis not present

## 2018-12-22 DIAGNOSIS — C61 Malignant neoplasm of prostate: Secondary | ICD-10-CM | POA: Diagnosis not present

## 2018-12-22 DIAGNOSIS — M549 Dorsalgia, unspecified: Secondary | ICD-10-CM | POA: Diagnosis not present

## 2018-12-22 NOTE — Telephone Encounter (Signed)
No los nor scheduled message per 4/1.

## 2018-12-22 NOTE — Telephone Encounter (Signed)
Received call from patient sister Lottie Dawson requesting that somehow she be a part of the visit as she is concerned the patient will not retain the information provided. Made her aware that the hospital system is not allowing any visitors at this time and no exceptions but Dr. Alen Blew stated that the patient can have his speaker phone on during the visit so she can listen to the information provided. Matthew Rangel verbalized understanding and is appreciative.

## 2018-12-22 NOTE — Progress Notes (Signed)
Hematology and Oncology Follow Up Visit  Matthew Rangel [id number] [birth year] 63 y.o. 12/22/2018 10:56 AM Hamrick, Lorin Mercy, MDHamrick, Lorin Mercy, MD   Principle Diagnosis: 63 year old man with advanced prostate cancer that is currently castration resistant with disease to the bone and lymphadenopathy.  He was initially diagnosed in 2015 with a Gleason score 3+4 equal 7 and PSA of 8.42.   Prior Therapy: He received Lupron for short period of time before proceeding with cryoablation in 2016.    His PSA increased and January 2018.  Axumin scan in February 2018 showed periaortic lymph node involvement at that time.  He subsequently started on Zoladex under the care of Dr. Jeffie Pollock.   His a PSA in January 2018 was 18.1 and decline to a nadir of 8.1 in October 2018.  His PSA was up to 9.9 in January 2019 and up to 21 in May 2019.    He did not receive any conventional treatment since that time and sought alternative therapies before establishing care at [facility] in February 2020.  He was restarted on androgen deprivation therapy in form of Lupron with Casodex with PSA up to 493 and alkaline phosphatase of 670.   Current therapy: Under consideration for different salvage therapy.  Interim History: Matthew Rangel presents today for a follow-up visit.  He is a gentleman I saw in consultation back in June 2020 and has been receiving his care elsewhere.  He did not receive any therapy for his cancer and has been rather neglected to reestablish care at [facility].  He was resumed on Lupron and Casodex although his PSA continues to rise rapidly.  He has also underwent a TURP procedure in February 2020 left nephrostomy tube was placed.  Clinically, he continues to deteriorate rather rapidly with increased symptoms of diffuse pain and difficulty eating.  He have also have reported symptoms of regurgitation and spitting constantly.  His performance status is declining and is  experiencing a lot of stress and anxiety related to that.   He does not report any headaches, blurry vision, syncope or seizures. Does not report any fevers, chills or sweats.  Does not report any cough, wheezing or hemoptysisI have reviewed the patient's current medications..  Does not report any chest pain, palpitation, orthopnea or leg edema.  Does not report any abdominal distention.  Does not report any constipation or diarrhea.  Does not report frequency, urgency or hematuria.  Does not report any skin rashes or lesions. Does not report any heat or cold intolerance.  Does not report any lymphadenopathy or petechiae.  Does not report any anxiety or depression.  Remaining review of systems is negative.    Medications:   Current Outpatient Medications  Medication Sig Dispense Refill  . aspirin EC 81 MG tablet Take 81 mg by mouth daily.    Marland Kitchen goserelin (ZOLADEX) 3.6 MG injection Inject 3.6 mg into the skin every 28 (twenty-eight) days.    Marland Kitchen levothyroxine (SYNTHROID, LEVOTHROID) 125 MCG tablet Take by mouth.     No current facility-administered medications for this visit.      Allergies: No Known Allergies  Past Medical History, Surgical history, Social history, and Family History were reviewed and updated.    Physical Exam: Blood pressure 126/82, pulse 87, temperature 97.7 F (36.5 C), temperature source Oral, resp. rate 18, height 6\' 4"  (1.93 m), weight 203 lb 4.8 oz (92.2 kg), SpO2 100 %.   ECOG: 2 General appearance: Chronically ill-appearing  gentleman with some distress. Head: Normocephalic, without obvious abnormality Oropharynx: No oral thrush or ulcers. Eyes: No scleral icterus.  Pupils are equal and round reactive to light. Lymph nodes: Cervical, supraclavicular, and axillary nodes normal. Heart:regular rate and rhythm, S1, S2 normal, no murmur, click, rub or gallop Lung:chest clear, no wheezing, rales, normal symmetric air entry Abdomin: soft, non-tender, without masses  or organomegaly. Neurological: No motor, sensory deficits.  Intact deep tendon reflexes.  Is able to ambulate without any difficulties. Skin: No rashes or lesions.  No ecchymosis or petechiae. Musculoskeletal: No joint deformity or effusion. Psychiatric: Mood and affect are appropriate.    Lab Results: No results found for: WBC, HGB, HCT, MCV, PLT   Chemistry      Component Value Date/Time   NA 138 10/15/2017 1310   K 4.0 10/15/2017 1310   CL 103 10/15/2017 1310   CO2 29 10/15/2017 1310   BUN 19 10/15/2017 1310   CREATININE 1.00 10/15/2017 1310      Component Value Date/Time   CALCIUM 8.9 10/15/2017 1310       Impression and Plan:   63 year old man with:  1.  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  His initial diagnosis was in 2015.  He is status post therapy outlined above although he has not been seeking conventional therapy for the last year or so.   The natural course of this disease was discussed today with the patient and his sister over the phone.  He has clear progression of his disease that is making him rather ill and quite debilitated.  The rapid rise in his PSA (in the 400 range in February 2020 compared to 35 in April 2018).  Bone scan obtained in January 2020 showed diffuse scattered uptake in his skeleton.  CT scan of the abdomen and pelvis showed large pelvic mass arising from the prostate with lymphatic spread with enlarged adenopathy.  These findings are very concerning for rapid progression of his disease and signal a poor outcome.  Treatment options at this time were reviewed and I feel his only option to reverse this trend of his disease would be systemic chemotherapy.  Risks and benefits of Taxotere chemotherapy occluding the rationale was discussed today.  Complications include nausea, vomiting, myelosuppression, neutropenia and neutropenic sepsis were reiterated.  Gillermina Phy and Fabio Asa are also  potential possibilities but I do not feel it would be adequate to palliate his symptoms urgently.  He understands the treatment at this point is palliative at best.  He remains in good health and rather young and aggressive therapy is warranted.  I did also give him the option of supportive care only given the rapid progression of his disease as well as hospice enrollment.  He will think about those options and let me know in the immediate future.  2.  Back pain: Palliative radiation therapy could be also used to improve his symptoms.  3.  Psychosocial stress: Is very anxious and distraught about his diagnosis prognosis as well as fear of the unknown.  I have offered him psychosocial support and he will be contacted for counseling.  4.  Antiemetics: Prescription for Compazine will be made available to him.  5.  IV access: Peripheral veins will be used initially and Port-A-Cath can be inserted if he is willing to proceed with chemotherapy.  6.  Prognosis: Overall poor with limited life expectancy.  He understands even with chemotherapy we are dealing with an incurable malignancy that  could be palliated for short period of time.  7.  Follow-up: To be determined in the near future.   40  minutes was spent with the patient face-to-face today.  More than 50% of time was dedicated to reviewing his disease status, laboratory data, imaging studies, discussion of treatment options as well as prognosis.  His questions and his sister's questions were answered over the phone as well.   Zola Button, MD 4/1/202010:56 AM

## 2018-12-23 ENCOUNTER — Telehealth: Payer: Self-pay

## 2018-12-23 NOTE — Telephone Encounter (Signed)
Received call from patient stating that he has chosen hospice care over treatment with chemotherapy. Answered questions regarding hospice care and initiation of services. Patient had no other questions or concerns and understands to call back if any arise. Contacted [place] [facility] and provided patient referral information to [facility].

## 2018-12-24 DIAGNOSIS — E039 Hypothyroidism, unspecified: Secondary | ICD-10-CM | POA: Diagnosis not present

## 2018-12-24 DIAGNOSIS — C61 Malignant neoplasm of prostate: Secondary | ICD-10-CM | POA: Diagnosis not present

## 2018-12-24 DIAGNOSIS — Z436 Encounter for attention to other artificial openings of urinary tract: Secondary | ICD-10-CM | POA: Diagnosis not present

## 2018-12-24 DIAGNOSIS — R590 Localized enlarged lymph nodes: Secondary | ICD-10-CM | POA: Diagnosis not present

## 2018-12-24 DIAGNOSIS — C7951 Secondary malignant neoplasm of bone: Secondary | ICD-10-CM | POA: Diagnosis not present

## 2018-12-24 DIAGNOSIS — N139 Obstructive and reflux uropathy, unspecified: Secondary | ICD-10-CM | POA: Diagnosis not present

## 2018-12-25 DIAGNOSIS — E039 Hypothyroidism, unspecified: Secondary | ICD-10-CM | POA: Diagnosis not present

## 2018-12-25 DIAGNOSIS — N139 Obstructive and reflux uropathy, unspecified: Secondary | ICD-10-CM | POA: Diagnosis not present

## 2018-12-25 DIAGNOSIS — C7951 Secondary malignant neoplasm of bone: Secondary | ICD-10-CM | POA: Diagnosis not present

## 2018-12-25 DIAGNOSIS — R590 Localized enlarged lymph nodes: Secondary | ICD-10-CM | POA: Diagnosis not present

## 2018-12-25 DIAGNOSIS — Z436 Encounter for attention to other artificial openings of urinary tract: Secondary | ICD-10-CM | POA: Diagnosis not present

## 2018-12-25 DIAGNOSIS — C61 Malignant neoplasm of prostate: Secondary | ICD-10-CM | POA: Diagnosis not present

## 2018-12-26 DIAGNOSIS — N139 Obstructive and reflux uropathy, unspecified: Secondary | ICD-10-CM | POA: Diagnosis not present

## 2018-12-26 DIAGNOSIS — C61 Malignant neoplasm of prostate: Secondary | ICD-10-CM | POA: Diagnosis not present

## 2018-12-26 DIAGNOSIS — C7951 Secondary malignant neoplasm of bone: Secondary | ICD-10-CM | POA: Diagnosis not present

## 2018-12-26 DIAGNOSIS — Z436 Encounter for attention to other artificial openings of urinary tract: Secondary | ICD-10-CM | POA: Diagnosis not present

## 2018-12-26 DIAGNOSIS — E039 Hypothyroidism, unspecified: Secondary | ICD-10-CM | POA: Diagnosis not present

## 2018-12-26 DIAGNOSIS — R590 Localized enlarged lymph nodes: Secondary | ICD-10-CM | POA: Diagnosis not present

## 2018-12-27 DIAGNOSIS — Z436 Encounter for attention to other artificial openings of urinary tract: Secondary | ICD-10-CM | POA: Diagnosis not present

## 2018-12-27 DIAGNOSIS — R590 Localized enlarged lymph nodes: Secondary | ICD-10-CM | POA: Diagnosis not present

## 2018-12-27 DIAGNOSIS — C7951 Secondary malignant neoplasm of bone: Secondary | ICD-10-CM | POA: Diagnosis not present

## 2018-12-27 DIAGNOSIS — E039 Hypothyroidism, unspecified: Secondary | ICD-10-CM | POA: Diagnosis not present

## 2018-12-27 DIAGNOSIS — N139 Obstructive and reflux uropathy, unspecified: Secondary | ICD-10-CM | POA: Diagnosis not present

## 2018-12-27 DIAGNOSIS — C61 Malignant neoplasm of prostate: Secondary | ICD-10-CM | POA: Diagnosis not present

## 2018-12-28 ENCOUNTER — Ambulatory Visit: Payer: 59 | Admitting: Oncology

## 2018-12-28 DIAGNOSIS — Z436 Encounter for attention to other artificial openings of urinary tract: Secondary | ICD-10-CM | POA: Diagnosis not present

## 2018-12-28 DIAGNOSIS — R590 Localized enlarged lymph nodes: Secondary | ICD-10-CM | POA: Diagnosis not present

## 2018-12-28 DIAGNOSIS — N139 Obstructive and reflux uropathy, unspecified: Secondary | ICD-10-CM | POA: Diagnosis not present

## 2018-12-28 DIAGNOSIS — E039 Hypothyroidism, unspecified: Secondary | ICD-10-CM | POA: Diagnosis not present

## 2018-12-28 DIAGNOSIS — C7951 Secondary malignant neoplasm of bone: Secondary | ICD-10-CM | POA: Diagnosis not present

## 2018-12-28 DIAGNOSIS — C61 Malignant neoplasm of prostate: Secondary | ICD-10-CM | POA: Diagnosis not present

## 2018-12-29 DIAGNOSIS — E039 Hypothyroidism, unspecified: Secondary | ICD-10-CM | POA: Diagnosis not present

## 2018-12-29 DIAGNOSIS — R590 Localized enlarged lymph nodes: Secondary | ICD-10-CM | POA: Diagnosis not present

## 2018-12-29 DIAGNOSIS — Z436 Encounter for attention to other artificial openings of urinary tract: Secondary | ICD-10-CM | POA: Diagnosis not present

## 2018-12-29 DIAGNOSIS — C7951 Secondary malignant neoplasm of bone: Secondary | ICD-10-CM | POA: Diagnosis not present

## 2018-12-29 DIAGNOSIS — N139 Obstructive and reflux uropathy, unspecified: Secondary | ICD-10-CM | POA: Diagnosis not present

## 2018-12-29 DIAGNOSIS — C61 Malignant neoplasm of prostate: Secondary | ICD-10-CM | POA: Diagnosis not present

## 2018-12-30 DIAGNOSIS — Z436 Encounter for attention to other artificial openings of urinary tract: Secondary | ICD-10-CM | POA: Diagnosis not present

## 2018-12-30 DIAGNOSIS — R590 Localized enlarged lymph nodes: Secondary | ICD-10-CM | POA: Diagnosis not present

## 2018-12-30 DIAGNOSIS — C61 Malignant neoplasm of prostate: Secondary | ICD-10-CM | POA: Diagnosis not present

## 2018-12-30 DIAGNOSIS — C7951 Secondary malignant neoplasm of bone: Secondary | ICD-10-CM | POA: Diagnosis not present

## 2018-12-30 DIAGNOSIS — E039 Hypothyroidism, unspecified: Secondary | ICD-10-CM | POA: Diagnosis not present

## 2018-12-30 DIAGNOSIS — N139 Obstructive and reflux uropathy, unspecified: Secondary | ICD-10-CM | POA: Diagnosis not present

## 2018-12-31 DIAGNOSIS — C7951 Secondary malignant neoplasm of bone: Secondary | ICD-10-CM | POA: Diagnosis not present

## 2018-12-31 DIAGNOSIS — N139 Obstructive and reflux uropathy, unspecified: Secondary | ICD-10-CM | POA: Diagnosis not present

## 2018-12-31 DIAGNOSIS — R590 Localized enlarged lymph nodes: Secondary | ICD-10-CM | POA: Diagnosis not present

## 2018-12-31 DIAGNOSIS — E039 Hypothyroidism, unspecified: Secondary | ICD-10-CM | POA: Diagnosis not present

## 2018-12-31 DIAGNOSIS — C61 Malignant neoplasm of prostate: Secondary | ICD-10-CM | POA: Diagnosis not present

## 2018-12-31 DIAGNOSIS — Z436 Encounter for attention to other artificial openings of urinary tract: Secondary | ICD-10-CM | POA: Diagnosis not present

## 2019-01-01 DIAGNOSIS — Z436 Encounter for attention to other artificial openings of urinary tract: Secondary | ICD-10-CM | POA: Diagnosis not present

## 2019-01-01 DIAGNOSIS — N139 Obstructive and reflux uropathy, unspecified: Secondary | ICD-10-CM | POA: Diagnosis not present

## 2019-01-01 DIAGNOSIS — E039 Hypothyroidism, unspecified: Secondary | ICD-10-CM | POA: Diagnosis not present

## 2019-01-01 DIAGNOSIS — C61 Malignant neoplasm of prostate: Secondary | ICD-10-CM | POA: Diagnosis not present

## 2019-01-01 DIAGNOSIS — C7951 Secondary malignant neoplasm of bone: Secondary | ICD-10-CM | POA: Diagnosis not present

## 2019-01-01 DIAGNOSIS — R590 Localized enlarged lymph nodes: Secondary | ICD-10-CM | POA: Diagnosis not present

## 2019-01-02 DIAGNOSIS — C7951 Secondary malignant neoplasm of bone: Secondary | ICD-10-CM | POA: Diagnosis not present

## 2019-01-02 DIAGNOSIS — E039 Hypothyroidism, unspecified: Secondary | ICD-10-CM | POA: Diagnosis not present

## 2019-01-02 DIAGNOSIS — C61 Malignant neoplasm of prostate: Secondary | ICD-10-CM | POA: Diagnosis not present

## 2019-01-02 DIAGNOSIS — R590 Localized enlarged lymph nodes: Secondary | ICD-10-CM | POA: Diagnosis not present

## 2019-01-02 DIAGNOSIS — N139 Obstructive and reflux uropathy, unspecified: Secondary | ICD-10-CM | POA: Diagnosis not present

## 2019-01-02 DIAGNOSIS — Z436 Encounter for attention to other artificial openings of urinary tract: Secondary | ICD-10-CM | POA: Diagnosis not present

## 2019-01-03 DIAGNOSIS — E039 Hypothyroidism, unspecified: Secondary | ICD-10-CM | POA: Diagnosis not present

## 2019-01-03 DIAGNOSIS — C7951 Secondary malignant neoplasm of bone: Secondary | ICD-10-CM | POA: Diagnosis not present

## 2019-01-03 DIAGNOSIS — N139 Obstructive and reflux uropathy, unspecified: Secondary | ICD-10-CM | POA: Diagnosis not present

## 2019-01-03 DIAGNOSIS — Z436 Encounter for attention to other artificial openings of urinary tract: Secondary | ICD-10-CM | POA: Diagnosis not present

## 2019-01-03 DIAGNOSIS — R590 Localized enlarged lymph nodes: Secondary | ICD-10-CM | POA: Diagnosis not present

## 2019-01-03 DIAGNOSIS — C61 Malignant neoplasm of prostate: Secondary | ICD-10-CM | POA: Diagnosis not present

## 2019-01-04 DIAGNOSIS — E039 Hypothyroidism, unspecified: Secondary | ICD-10-CM | POA: Diagnosis not present

## 2019-01-04 DIAGNOSIS — N139 Obstructive and reflux uropathy, unspecified: Secondary | ICD-10-CM | POA: Diagnosis not present

## 2019-01-04 DIAGNOSIS — C61 Malignant neoplasm of prostate: Secondary | ICD-10-CM | POA: Diagnosis not present

## 2019-01-04 DIAGNOSIS — C7951 Secondary malignant neoplasm of bone: Secondary | ICD-10-CM | POA: Diagnosis not present

## 2019-01-04 DIAGNOSIS — R590 Localized enlarged lymph nodes: Secondary | ICD-10-CM | POA: Diagnosis not present

## 2019-01-04 DIAGNOSIS — Z436 Encounter for attention to other artificial openings of urinary tract: Secondary | ICD-10-CM | POA: Diagnosis not present

## 2019-01-05 DIAGNOSIS — C7951 Secondary malignant neoplasm of bone: Secondary | ICD-10-CM | POA: Diagnosis not present

## 2019-01-05 DIAGNOSIS — R590 Localized enlarged lymph nodes: Secondary | ICD-10-CM | POA: Diagnosis not present

## 2019-01-05 DIAGNOSIS — N139 Obstructive and reflux uropathy, unspecified: Secondary | ICD-10-CM | POA: Diagnosis not present

## 2019-01-05 DIAGNOSIS — C61 Malignant neoplasm of prostate: Secondary | ICD-10-CM | POA: Diagnosis not present

## 2019-01-05 DIAGNOSIS — Z436 Encounter for attention to other artificial openings of urinary tract: Secondary | ICD-10-CM | POA: Diagnosis not present

## 2019-01-05 DIAGNOSIS — E039 Hypothyroidism, unspecified: Secondary | ICD-10-CM | POA: Diagnosis not present

## 2019-01-06 DIAGNOSIS — R590 Localized enlarged lymph nodes: Secondary | ICD-10-CM | POA: Diagnosis not present

## 2019-01-06 DIAGNOSIS — Z436 Encounter for attention to other artificial openings of urinary tract: Secondary | ICD-10-CM | POA: Diagnosis not present

## 2019-01-06 DIAGNOSIS — C61 Malignant neoplasm of prostate: Secondary | ICD-10-CM | POA: Diagnosis not present

## 2019-01-06 DIAGNOSIS — N139 Obstructive and reflux uropathy, unspecified: Secondary | ICD-10-CM | POA: Diagnosis not present

## 2019-01-06 DIAGNOSIS — E039 Hypothyroidism, unspecified: Secondary | ICD-10-CM | POA: Diagnosis not present

## 2019-01-06 DIAGNOSIS — C7951 Secondary malignant neoplasm of bone: Secondary | ICD-10-CM | POA: Diagnosis not present

## 2019-01-07 DIAGNOSIS — R590 Localized enlarged lymph nodes: Secondary | ICD-10-CM | POA: Diagnosis not present

## 2019-01-07 DIAGNOSIS — E039 Hypothyroidism, unspecified: Secondary | ICD-10-CM | POA: Diagnosis not present

## 2019-01-07 DIAGNOSIS — C7951 Secondary malignant neoplasm of bone: Secondary | ICD-10-CM | POA: Diagnosis not present

## 2019-01-07 DIAGNOSIS — C61 Malignant neoplasm of prostate: Secondary | ICD-10-CM | POA: Diagnosis not present

## 2019-01-07 DIAGNOSIS — Z436 Encounter for attention to other artificial openings of urinary tract: Secondary | ICD-10-CM | POA: Diagnosis not present

## 2019-01-07 DIAGNOSIS — N139 Obstructive and reflux uropathy, unspecified: Secondary | ICD-10-CM | POA: Diagnosis not present

## 2019-01-08 DIAGNOSIS — R590 Localized enlarged lymph nodes: Secondary | ICD-10-CM | POA: Diagnosis not present

## 2019-01-08 DIAGNOSIS — E039 Hypothyroidism, unspecified: Secondary | ICD-10-CM | POA: Diagnosis not present

## 2019-01-08 DIAGNOSIS — C7951 Secondary malignant neoplasm of bone: Secondary | ICD-10-CM | POA: Diagnosis not present

## 2019-01-08 DIAGNOSIS — C61 Malignant neoplasm of prostate: Secondary | ICD-10-CM | POA: Diagnosis not present

## 2019-01-08 DIAGNOSIS — Z436 Encounter for attention to other artificial openings of urinary tract: Secondary | ICD-10-CM | POA: Diagnosis not present

## 2019-01-08 DIAGNOSIS — N139 Obstructive and reflux uropathy, unspecified: Secondary | ICD-10-CM | POA: Diagnosis not present

## 2019-01-09 DIAGNOSIS — R590 Localized enlarged lymph nodes: Secondary | ICD-10-CM | POA: Diagnosis not present

## 2019-01-09 DIAGNOSIS — Z436 Encounter for attention to other artificial openings of urinary tract: Secondary | ICD-10-CM | POA: Diagnosis not present

## 2019-01-09 DIAGNOSIS — E039 Hypothyroidism, unspecified: Secondary | ICD-10-CM | POA: Diagnosis not present

## 2019-01-09 DIAGNOSIS — C61 Malignant neoplasm of prostate: Secondary | ICD-10-CM | POA: Diagnosis not present

## 2019-01-09 DIAGNOSIS — C7951 Secondary malignant neoplasm of bone: Secondary | ICD-10-CM | POA: Diagnosis not present

## 2019-01-09 DIAGNOSIS — N139 Obstructive and reflux uropathy, unspecified: Secondary | ICD-10-CM | POA: Diagnosis not present

## 2019-01-10 DIAGNOSIS — C61 Malignant neoplasm of prostate: Secondary | ICD-10-CM | POA: Diagnosis not present

## 2019-01-10 DIAGNOSIS — C7951 Secondary malignant neoplasm of bone: Secondary | ICD-10-CM | POA: Diagnosis not present

## 2019-01-10 DIAGNOSIS — Z436 Encounter for attention to other artificial openings of urinary tract: Secondary | ICD-10-CM | POA: Diagnosis not present

## 2019-01-10 DIAGNOSIS — N139 Obstructive and reflux uropathy, unspecified: Secondary | ICD-10-CM | POA: Diagnosis not present

## 2019-01-10 DIAGNOSIS — E039 Hypothyroidism, unspecified: Secondary | ICD-10-CM | POA: Diagnosis not present

## 2019-01-10 DIAGNOSIS — R590 Localized enlarged lymph nodes: Secondary | ICD-10-CM | POA: Diagnosis not present

## 2019-01-11 DIAGNOSIS — Z436 Encounter for attention to other artificial openings of urinary tract: Secondary | ICD-10-CM | POA: Diagnosis not present

## 2019-01-11 DIAGNOSIS — C7951 Secondary malignant neoplasm of bone: Secondary | ICD-10-CM | POA: Diagnosis not present

## 2019-01-11 DIAGNOSIS — R590 Localized enlarged lymph nodes: Secondary | ICD-10-CM | POA: Diagnosis not present

## 2019-01-11 DIAGNOSIS — E039 Hypothyroidism, unspecified: Secondary | ICD-10-CM | POA: Diagnosis not present

## 2019-01-11 DIAGNOSIS — N139 Obstructive and reflux uropathy, unspecified: Secondary | ICD-10-CM | POA: Diagnosis not present

## 2019-01-11 DIAGNOSIS — C61 Malignant neoplasm of prostate: Secondary | ICD-10-CM | POA: Diagnosis not present

## 2019-01-12 DIAGNOSIS — N139 Obstructive and reflux uropathy, unspecified: Secondary | ICD-10-CM | POA: Diagnosis not present

## 2019-01-12 DIAGNOSIS — C7951 Secondary malignant neoplasm of bone: Secondary | ICD-10-CM | POA: Diagnosis not present

## 2019-01-12 DIAGNOSIS — E039 Hypothyroidism, unspecified: Secondary | ICD-10-CM | POA: Diagnosis not present

## 2019-01-12 DIAGNOSIS — R590 Localized enlarged lymph nodes: Secondary | ICD-10-CM | POA: Diagnosis not present

## 2019-01-12 DIAGNOSIS — Z436 Encounter for attention to other artificial openings of urinary tract: Secondary | ICD-10-CM | POA: Diagnosis not present

## 2019-01-12 DIAGNOSIS — C61 Malignant neoplasm of prostate: Secondary | ICD-10-CM | POA: Diagnosis not present

## 2019-01-13 DIAGNOSIS — C61 Malignant neoplasm of prostate: Secondary | ICD-10-CM | POA: Diagnosis not present

## 2019-01-13 DIAGNOSIS — Z436 Encounter for attention to other artificial openings of urinary tract: Secondary | ICD-10-CM | POA: Diagnosis not present

## 2019-01-13 DIAGNOSIS — C7951 Secondary malignant neoplasm of bone: Secondary | ICD-10-CM | POA: Diagnosis not present

## 2019-01-13 DIAGNOSIS — R590 Localized enlarged lymph nodes: Secondary | ICD-10-CM | POA: Diagnosis not present

## 2019-01-13 DIAGNOSIS — E039 Hypothyroidism, unspecified: Secondary | ICD-10-CM | POA: Diagnosis not present

## 2019-01-13 DIAGNOSIS — N139 Obstructive and reflux uropathy, unspecified: Secondary | ICD-10-CM | POA: Diagnosis not present

## 2019-01-14 DIAGNOSIS — R590 Localized enlarged lymph nodes: Secondary | ICD-10-CM | POA: Diagnosis not present

## 2019-01-14 DIAGNOSIS — N139 Obstructive and reflux uropathy, unspecified: Secondary | ICD-10-CM | POA: Diagnosis not present

## 2019-01-14 DIAGNOSIS — Z466 Encounter for fitting and adjustment of urinary device: Secondary | ICD-10-CM | POA: Diagnosis not present

## 2019-01-14 DIAGNOSIS — E039 Hypothyroidism, unspecified: Secondary | ICD-10-CM | POA: Diagnosis not present

## 2019-01-14 DIAGNOSIS — C7951 Secondary malignant neoplasm of bone: Secondary | ICD-10-CM | POA: Diagnosis not present

## 2019-01-14 DIAGNOSIS — C61 Malignant neoplasm of prostate: Secondary | ICD-10-CM | POA: Diagnosis not present

## 2019-01-14 DIAGNOSIS — Z436 Encounter for attention to other artificial openings of urinary tract: Secondary | ICD-10-CM | POA: Diagnosis not present

## 2019-01-14 DIAGNOSIS — N1330 Unspecified hydronephrosis: Secondary | ICD-10-CM | POA: Diagnosis not present

## 2019-01-15 DIAGNOSIS — Z436 Encounter for attention to other artificial openings of urinary tract: Secondary | ICD-10-CM | POA: Diagnosis not present

## 2019-01-15 DIAGNOSIS — N139 Obstructive and reflux uropathy, unspecified: Secondary | ICD-10-CM | POA: Diagnosis not present

## 2019-01-15 DIAGNOSIS — R590 Localized enlarged lymph nodes: Secondary | ICD-10-CM | POA: Diagnosis not present

## 2019-01-15 DIAGNOSIS — C7951 Secondary malignant neoplasm of bone: Secondary | ICD-10-CM | POA: Diagnosis not present

## 2019-01-15 DIAGNOSIS — E039 Hypothyroidism, unspecified: Secondary | ICD-10-CM | POA: Diagnosis not present

## 2019-01-15 DIAGNOSIS — C61 Malignant neoplasm of prostate: Secondary | ICD-10-CM | POA: Diagnosis not present

## 2019-01-16 DIAGNOSIS — R590 Localized enlarged lymph nodes: Secondary | ICD-10-CM | POA: Diagnosis not present

## 2019-01-16 DIAGNOSIS — E039 Hypothyroidism, unspecified: Secondary | ICD-10-CM | POA: Diagnosis not present

## 2019-01-16 DIAGNOSIS — C7951 Secondary malignant neoplasm of bone: Secondary | ICD-10-CM | POA: Diagnosis not present

## 2019-01-16 DIAGNOSIS — C61 Malignant neoplasm of prostate: Secondary | ICD-10-CM | POA: Diagnosis not present

## 2019-01-16 DIAGNOSIS — Z436 Encounter for attention to other artificial openings of urinary tract: Secondary | ICD-10-CM | POA: Diagnosis not present

## 2019-01-16 DIAGNOSIS — N139 Obstructive and reflux uropathy, unspecified: Secondary | ICD-10-CM | POA: Diagnosis not present

## 2019-01-17 DIAGNOSIS — E039 Hypothyroidism, unspecified: Secondary | ICD-10-CM | POA: Diagnosis not present

## 2019-01-17 DIAGNOSIS — C61 Malignant neoplasm of prostate: Secondary | ICD-10-CM | POA: Diagnosis not present

## 2019-01-17 DIAGNOSIS — C7951 Secondary malignant neoplasm of bone: Secondary | ICD-10-CM | POA: Diagnosis not present

## 2019-01-17 DIAGNOSIS — R590 Localized enlarged lymph nodes: Secondary | ICD-10-CM | POA: Diagnosis not present

## 2019-01-17 DIAGNOSIS — N139 Obstructive and reflux uropathy, unspecified: Secondary | ICD-10-CM | POA: Diagnosis not present

## 2019-01-17 DIAGNOSIS — Z436 Encounter for attention to other artificial openings of urinary tract: Secondary | ICD-10-CM | POA: Diagnosis not present

## 2019-01-18 DIAGNOSIS — R590 Localized enlarged lymph nodes: Secondary | ICD-10-CM | POA: Diagnosis not present

## 2019-01-18 DIAGNOSIS — E039 Hypothyroidism, unspecified: Secondary | ICD-10-CM | POA: Diagnosis not present

## 2019-01-18 DIAGNOSIS — Z436 Encounter for attention to other artificial openings of urinary tract: Secondary | ICD-10-CM | POA: Diagnosis not present

## 2019-01-18 DIAGNOSIS — C7951 Secondary malignant neoplasm of bone: Secondary | ICD-10-CM | POA: Diagnosis not present

## 2019-01-18 DIAGNOSIS — C61 Malignant neoplasm of prostate: Secondary | ICD-10-CM | POA: Diagnosis not present

## 2019-01-18 DIAGNOSIS — N139 Obstructive and reflux uropathy, unspecified: Secondary | ICD-10-CM | POA: Diagnosis not present

## 2019-01-19 DIAGNOSIS — C61 Malignant neoplasm of prostate: Secondary | ICD-10-CM | POA: Diagnosis not present

## 2019-01-19 DIAGNOSIS — C7951 Secondary malignant neoplasm of bone: Secondary | ICD-10-CM | POA: Diagnosis not present

## 2019-01-19 DIAGNOSIS — R590 Localized enlarged lymph nodes: Secondary | ICD-10-CM | POA: Diagnosis not present

## 2019-01-19 DIAGNOSIS — Z436 Encounter for attention to other artificial openings of urinary tract: Secondary | ICD-10-CM | POA: Diagnosis not present

## 2019-01-19 DIAGNOSIS — E039 Hypothyroidism, unspecified: Secondary | ICD-10-CM | POA: Diagnosis not present

## 2019-01-19 DIAGNOSIS — N139 Obstructive and reflux uropathy, unspecified: Secondary | ICD-10-CM | POA: Diagnosis not present

## 2019-01-20 DIAGNOSIS — R590 Localized enlarged lymph nodes: Secondary | ICD-10-CM | POA: Diagnosis not present

## 2019-01-20 DIAGNOSIS — C7951 Secondary malignant neoplasm of bone: Secondary | ICD-10-CM | POA: Diagnosis not present

## 2019-01-20 DIAGNOSIS — E039 Hypothyroidism, unspecified: Secondary | ICD-10-CM | POA: Diagnosis not present

## 2019-01-20 DIAGNOSIS — C61 Malignant neoplasm of prostate: Secondary | ICD-10-CM | POA: Diagnosis not present

## 2019-01-20 DIAGNOSIS — N139 Obstructive and reflux uropathy, unspecified: Secondary | ICD-10-CM | POA: Diagnosis not present

## 2019-01-20 DIAGNOSIS — Z436 Encounter for attention to other artificial openings of urinary tract: Secondary | ICD-10-CM | POA: Diagnosis not present

## 2019-01-21 DIAGNOSIS — C61 Malignant neoplasm of prostate: Secondary | ICD-10-CM | POA: Diagnosis not present

## 2019-01-21 DIAGNOSIS — Z436 Encounter for attention to other artificial openings of urinary tract: Secondary | ICD-10-CM | POA: Diagnosis not present

## 2019-01-21 DIAGNOSIS — N139 Obstructive and reflux uropathy, unspecified: Secondary | ICD-10-CM | POA: Diagnosis not present

## 2019-01-21 DIAGNOSIS — C7951 Secondary malignant neoplasm of bone: Secondary | ICD-10-CM | POA: Diagnosis not present

## 2019-01-21 DIAGNOSIS — R590 Localized enlarged lymph nodes: Secondary | ICD-10-CM | POA: Diagnosis not present

## 2019-01-21 DIAGNOSIS — E039 Hypothyroidism, unspecified: Secondary | ICD-10-CM | POA: Diagnosis not present

## 2019-01-22 DIAGNOSIS — N139 Obstructive and reflux uropathy, unspecified: Secondary | ICD-10-CM | POA: Diagnosis not present

## 2019-01-22 DIAGNOSIS — R590 Localized enlarged lymph nodes: Secondary | ICD-10-CM | POA: Diagnosis not present

## 2019-01-22 DIAGNOSIS — Z436 Encounter for attention to other artificial openings of urinary tract: Secondary | ICD-10-CM | POA: Diagnosis not present

## 2019-01-22 DIAGNOSIS — C61 Malignant neoplasm of prostate: Secondary | ICD-10-CM | POA: Diagnosis not present

## 2019-01-22 DIAGNOSIS — C7951 Secondary malignant neoplasm of bone: Secondary | ICD-10-CM | POA: Diagnosis not present

## 2019-01-22 DIAGNOSIS — E039 Hypothyroidism, unspecified: Secondary | ICD-10-CM | POA: Diagnosis not present

## 2019-01-23 DIAGNOSIS — Z436 Encounter for attention to other artificial openings of urinary tract: Secondary | ICD-10-CM | POA: Diagnosis not present

## 2019-01-23 DIAGNOSIS — N139 Obstructive and reflux uropathy, unspecified: Secondary | ICD-10-CM | POA: Diagnosis not present

## 2019-01-23 DIAGNOSIS — R590 Localized enlarged lymph nodes: Secondary | ICD-10-CM | POA: Diagnosis not present

## 2019-01-23 DIAGNOSIS — C7951 Secondary malignant neoplasm of bone: Secondary | ICD-10-CM | POA: Diagnosis not present

## 2019-01-23 DIAGNOSIS — E039 Hypothyroidism, unspecified: Secondary | ICD-10-CM | POA: Diagnosis not present

## 2019-01-23 DIAGNOSIS — C61 Malignant neoplasm of prostate: Secondary | ICD-10-CM | POA: Diagnosis not present

## 2019-01-24 DIAGNOSIS — Z436 Encounter for attention to other artificial openings of urinary tract: Secondary | ICD-10-CM | POA: Diagnosis not present

## 2019-01-24 DIAGNOSIS — C61 Malignant neoplasm of prostate: Secondary | ICD-10-CM | POA: Diagnosis not present

## 2019-01-24 DIAGNOSIS — E039 Hypothyroidism, unspecified: Secondary | ICD-10-CM | POA: Diagnosis not present

## 2019-01-24 DIAGNOSIS — N139 Obstructive and reflux uropathy, unspecified: Secondary | ICD-10-CM | POA: Diagnosis not present

## 2019-01-24 DIAGNOSIS — R590 Localized enlarged lymph nodes: Secondary | ICD-10-CM | POA: Diagnosis not present

## 2019-01-24 DIAGNOSIS — C7951 Secondary malignant neoplasm of bone: Secondary | ICD-10-CM | POA: Diagnosis not present

## 2019-01-25 DIAGNOSIS — C61 Malignant neoplasm of prostate: Secondary | ICD-10-CM | POA: Diagnosis not present

## 2019-01-25 DIAGNOSIS — C7951 Secondary malignant neoplasm of bone: Secondary | ICD-10-CM | POA: Diagnosis not present

## 2019-01-25 DIAGNOSIS — E039 Hypothyroidism, unspecified: Secondary | ICD-10-CM | POA: Diagnosis not present

## 2019-01-25 DIAGNOSIS — N139 Obstructive and reflux uropathy, unspecified: Secondary | ICD-10-CM | POA: Diagnosis not present

## 2019-01-25 DIAGNOSIS — R590 Localized enlarged lymph nodes: Secondary | ICD-10-CM | POA: Diagnosis not present

## 2019-01-25 DIAGNOSIS — Z436 Encounter for attention to other artificial openings of urinary tract: Secondary | ICD-10-CM | POA: Diagnosis not present

## 2019-01-26 DIAGNOSIS — E039 Hypothyroidism, unspecified: Secondary | ICD-10-CM | POA: Diagnosis not present

## 2019-01-26 DIAGNOSIS — C61 Malignant neoplasm of prostate: Secondary | ICD-10-CM | POA: Diagnosis not present

## 2019-01-26 DIAGNOSIS — N139 Obstructive and reflux uropathy, unspecified: Secondary | ICD-10-CM | POA: Diagnosis not present

## 2019-01-26 DIAGNOSIS — R590 Localized enlarged lymph nodes: Secondary | ICD-10-CM | POA: Diagnosis not present

## 2019-01-26 DIAGNOSIS — C7951 Secondary malignant neoplasm of bone: Secondary | ICD-10-CM | POA: Diagnosis not present

## 2019-01-26 DIAGNOSIS — Z436 Encounter for attention to other artificial openings of urinary tract: Secondary | ICD-10-CM | POA: Diagnosis not present

## 2019-01-27 ENCOUNTER — Telehealth: Payer: Self-pay | Admitting: *Deleted

## 2019-01-27 DIAGNOSIS — R590 Localized enlarged lymph nodes: Secondary | ICD-10-CM | POA: Diagnosis not present

## 2019-01-27 DIAGNOSIS — E039 Hypothyroidism, unspecified: Secondary | ICD-10-CM | POA: Diagnosis not present

## 2019-01-27 DIAGNOSIS — C7951 Secondary malignant neoplasm of bone: Secondary | ICD-10-CM | POA: Diagnosis not present

## 2019-01-27 DIAGNOSIS — C61 Malignant neoplasm of prostate: Secondary | ICD-10-CM | POA: Diagnosis not present

## 2019-01-27 DIAGNOSIS — Z436 Encounter for attention to other artificial openings of urinary tract: Secondary | ICD-10-CM | POA: Diagnosis not present

## 2019-01-27 DIAGNOSIS — N139 Obstructive and reflux uropathy, unspecified: Secondary | ICD-10-CM | POA: Diagnosis not present

## 2019-01-27 NOTE — Telephone Encounter (Signed)
Patients sister Jonne Ply [phone] called to advise that patient has considered his options and if Dr Alen Blew still feels like he can help the patient then he wants to proceed with treatment options.  Routed message to Dr Alen Blew to see if a follow up visit is warranted again before proceeding with any scheduling.  Please call sister Mateo Flow with update.

## 2019-01-27 NOTE — Telephone Encounter (Signed)
Spoke with sister and she was very saddened to hear about the recommendation of hospice.  She was hopeful for some options since he was given approximately 1 month to live and since one month has passed.    No further questions at this point from the sister.

## 2019-01-27 NOTE — Telephone Encounter (Signed)
I don't think he would benefit from treatment. I will call him tomorrow to recommend hospice.

## 2019-01-28 DIAGNOSIS — C7951 Secondary malignant neoplasm of bone: Secondary | ICD-10-CM | POA: Diagnosis not present

## 2019-01-28 DIAGNOSIS — Z436 Encounter for attention to other artificial openings of urinary tract: Secondary | ICD-10-CM | POA: Diagnosis not present

## 2019-01-28 DIAGNOSIS — E039 Hypothyroidism, unspecified: Secondary | ICD-10-CM | POA: Diagnosis not present

## 2019-01-28 DIAGNOSIS — R590 Localized enlarged lymph nodes: Secondary | ICD-10-CM | POA: Diagnosis not present

## 2019-01-28 DIAGNOSIS — C61 Malignant neoplasm of prostate: Secondary | ICD-10-CM | POA: Diagnosis not present

## 2019-01-28 DIAGNOSIS — N139 Obstructive and reflux uropathy, unspecified: Secondary | ICD-10-CM | POA: Diagnosis not present

## 2019-01-29 DIAGNOSIS — Z436 Encounter for attention to other artificial openings of urinary tract: Secondary | ICD-10-CM | POA: Diagnosis not present

## 2019-01-29 DIAGNOSIS — R590 Localized enlarged lymph nodes: Secondary | ICD-10-CM | POA: Diagnosis not present

## 2019-01-29 DIAGNOSIS — C7951 Secondary malignant neoplasm of bone: Secondary | ICD-10-CM | POA: Diagnosis not present

## 2019-01-29 DIAGNOSIS — C61 Malignant neoplasm of prostate: Secondary | ICD-10-CM | POA: Diagnosis not present

## 2019-01-29 DIAGNOSIS — N139 Obstructive and reflux uropathy, unspecified: Secondary | ICD-10-CM | POA: Diagnosis not present

## 2019-01-29 DIAGNOSIS — E039 Hypothyroidism, unspecified: Secondary | ICD-10-CM | POA: Diagnosis not present

## 2019-01-30 DIAGNOSIS — N139 Obstructive and reflux uropathy, unspecified: Secondary | ICD-10-CM | POA: Diagnosis not present

## 2019-01-30 DIAGNOSIS — Z436 Encounter for attention to other artificial openings of urinary tract: Secondary | ICD-10-CM | POA: Diagnosis not present

## 2019-01-30 DIAGNOSIS — R590 Localized enlarged lymph nodes: Secondary | ICD-10-CM | POA: Diagnosis not present

## 2019-01-30 DIAGNOSIS — C7951 Secondary malignant neoplasm of bone: Secondary | ICD-10-CM | POA: Diagnosis not present

## 2019-01-30 DIAGNOSIS — E039 Hypothyroidism, unspecified: Secondary | ICD-10-CM | POA: Diagnosis not present

## 2019-01-30 DIAGNOSIS — C61 Malignant neoplasm of prostate: Secondary | ICD-10-CM | POA: Diagnosis not present

## 2019-01-31 DIAGNOSIS — N139 Obstructive and reflux uropathy, unspecified: Secondary | ICD-10-CM | POA: Diagnosis not present

## 2019-01-31 DIAGNOSIS — Z436 Encounter for attention to other artificial openings of urinary tract: Secondary | ICD-10-CM | POA: Diagnosis not present

## 2019-01-31 DIAGNOSIS — R590 Localized enlarged lymph nodes: Secondary | ICD-10-CM | POA: Diagnosis not present

## 2019-01-31 DIAGNOSIS — E039 Hypothyroidism, unspecified: Secondary | ICD-10-CM | POA: Diagnosis not present

## 2019-01-31 DIAGNOSIS — C7951 Secondary malignant neoplasm of bone: Secondary | ICD-10-CM | POA: Diagnosis not present

## 2019-01-31 DIAGNOSIS — C61 Malignant neoplasm of prostate: Secondary | ICD-10-CM | POA: Diagnosis not present

## 2019-02-01 ENCOUNTER — Telehealth: Payer: Self-pay

## 2019-02-01 ENCOUNTER — Other Ambulatory Visit: Payer: Self-pay | Admitting: Oncology

## 2019-02-01 DIAGNOSIS — N139 Obstructive and reflux uropathy, unspecified: Secondary | ICD-10-CM | POA: Diagnosis not present

## 2019-02-01 DIAGNOSIS — Z436 Encounter for attention to other artificial openings of urinary tract: Secondary | ICD-10-CM | POA: Diagnosis not present

## 2019-02-01 DIAGNOSIS — C7951 Secondary malignant neoplasm of bone: Secondary | ICD-10-CM | POA: Diagnosis not present

## 2019-02-01 DIAGNOSIS — E039 Hypothyroidism, unspecified: Secondary | ICD-10-CM | POA: Diagnosis not present

## 2019-02-01 DIAGNOSIS — C61 Malignant neoplasm of prostate: Secondary | ICD-10-CM | POA: Diagnosis not present

## 2019-02-01 DIAGNOSIS — R590 Localized enlarged lymph nodes: Secondary | ICD-10-CM | POA: Diagnosis not present

## 2019-02-01 NOTE — Telephone Encounter (Signed)
Received a message to follow up with patient regarding request from sister to stop hospice and possibly start a treatment regimen. Spoke with patient and he stated that he continues to receive hospice services in his home, he feels "weakish", but feels that he would possibly be able to tolerate chemotherapy if it would provide longevity. He is requesting a follow up visit/phone call with Dr. Alen Blew to discuss options. Dr. Alen Blew made aware of patient request.

## 2019-02-01 NOTE — Progress Notes (Signed)
I called Mr. Matthew Rangel today to review his clinical status as he is expressing desire to resume anticancer therapy after he has been on hospice for the last 6 weeks.  He reports that he has been thriving reasonably well with pain under control using methadone and oxycodone.  He is ambulatory with the help of a cane at times.  He does spend most of the day in his house although he walks outside occasionally.  The risks and benefits of starting systemic chemotherapy was discussed with him today.  Complication associated with chemotherapy would include nausea, vomiting, myelosuppression, neutropenia, neutropenic sepsis among others.  The benefit at this time would be to decrease his cancer volume, decrease his pain and improve overall life expectancy by few months.  Alternatively, continuing with hospice given the fact that he is thriving is a reasonable option as well.  He understands if he decides to proceed with chemotherapy, hospice services will be suspended and he will require repeat staging work-up including CT scan of the chest abdomen pelvis.  He also understands that his quality of life might decline on chemotherapy temporarily with potential improvement upon discontinuation of treatment.  After discussion today, he will consider this option and let me know in the near future.

## 2019-02-02 DIAGNOSIS — R590 Localized enlarged lymph nodes: Secondary | ICD-10-CM | POA: Diagnosis not present

## 2019-02-02 DIAGNOSIS — E039 Hypothyroidism, unspecified: Secondary | ICD-10-CM | POA: Diagnosis not present

## 2019-02-02 DIAGNOSIS — N139 Obstructive and reflux uropathy, unspecified: Secondary | ICD-10-CM | POA: Diagnosis not present

## 2019-02-02 DIAGNOSIS — C7951 Secondary malignant neoplasm of bone: Secondary | ICD-10-CM | POA: Diagnosis not present

## 2019-02-02 DIAGNOSIS — C61 Malignant neoplasm of prostate: Secondary | ICD-10-CM | POA: Diagnosis not present

## 2019-02-02 DIAGNOSIS — Z436 Encounter for attention to other artificial openings of urinary tract: Secondary | ICD-10-CM | POA: Diagnosis not present

## 2019-02-03 DIAGNOSIS — C61 Malignant neoplasm of prostate: Secondary | ICD-10-CM | POA: Diagnosis not present

## 2019-02-03 DIAGNOSIS — Z436 Encounter for attention to other artificial openings of urinary tract: Secondary | ICD-10-CM | POA: Diagnosis not present

## 2019-02-03 DIAGNOSIS — E039 Hypothyroidism, unspecified: Secondary | ICD-10-CM | POA: Diagnosis not present

## 2019-02-03 DIAGNOSIS — N139 Obstructive and reflux uropathy, unspecified: Secondary | ICD-10-CM | POA: Diagnosis not present

## 2019-02-03 DIAGNOSIS — C7951 Secondary malignant neoplasm of bone: Secondary | ICD-10-CM | POA: Diagnosis not present

## 2019-02-03 DIAGNOSIS — R590 Localized enlarged lymph nodes: Secondary | ICD-10-CM | POA: Diagnosis not present

## 2019-02-04 ENCOUNTER — Telehealth: Payer: Self-pay

## 2019-02-04 DIAGNOSIS — E039 Hypothyroidism, unspecified: Secondary | ICD-10-CM | POA: Diagnosis not present

## 2019-02-04 DIAGNOSIS — R590 Localized enlarged lymph nodes: Secondary | ICD-10-CM | POA: Diagnosis not present

## 2019-02-04 DIAGNOSIS — C61 Malignant neoplasm of prostate: Secondary | ICD-10-CM | POA: Diagnosis not present

## 2019-02-04 DIAGNOSIS — N139 Obstructive and reflux uropathy, unspecified: Secondary | ICD-10-CM | POA: Diagnosis not present

## 2019-02-04 DIAGNOSIS — Z436 Encounter for attention to other artificial openings of urinary tract: Secondary | ICD-10-CM | POA: Diagnosis not present

## 2019-02-04 DIAGNOSIS — C7951 Secondary malignant neoplasm of bone: Secondary | ICD-10-CM | POA: Diagnosis not present

## 2019-02-04 NOTE — Telephone Encounter (Signed)
Received call from patient stating that he does not want to receive chemotherapy and will remain with hospice services. Explained that will make Dr. Alen Blew aware of his decision.

## 2019-02-05 DIAGNOSIS — R590 Localized enlarged lymph nodes: Secondary | ICD-10-CM | POA: Diagnosis not present

## 2019-02-05 DIAGNOSIS — Z436 Encounter for attention to other artificial openings of urinary tract: Secondary | ICD-10-CM | POA: Diagnosis not present

## 2019-02-05 DIAGNOSIS — C61 Malignant neoplasm of prostate: Secondary | ICD-10-CM | POA: Diagnosis not present

## 2019-02-05 DIAGNOSIS — C7951 Secondary malignant neoplasm of bone: Secondary | ICD-10-CM | POA: Diagnosis not present

## 2019-02-05 DIAGNOSIS — E039 Hypothyroidism, unspecified: Secondary | ICD-10-CM | POA: Diagnosis not present

## 2019-02-05 DIAGNOSIS — N139 Obstructive and reflux uropathy, unspecified: Secondary | ICD-10-CM | POA: Diagnosis not present

## 2019-02-06 DIAGNOSIS — E039 Hypothyroidism, unspecified: Secondary | ICD-10-CM | POA: Diagnosis not present

## 2019-02-06 DIAGNOSIS — Z436 Encounter for attention to other artificial openings of urinary tract: Secondary | ICD-10-CM | POA: Diagnosis not present

## 2019-02-06 DIAGNOSIS — N139 Obstructive and reflux uropathy, unspecified: Secondary | ICD-10-CM | POA: Diagnosis not present

## 2019-02-06 DIAGNOSIS — R590 Localized enlarged lymph nodes: Secondary | ICD-10-CM | POA: Diagnosis not present

## 2019-02-06 DIAGNOSIS — C7951 Secondary malignant neoplasm of bone: Secondary | ICD-10-CM | POA: Diagnosis not present

## 2019-02-06 DIAGNOSIS — C61 Malignant neoplasm of prostate: Secondary | ICD-10-CM | POA: Diagnosis not present

## 2019-02-07 DIAGNOSIS — R590 Localized enlarged lymph nodes: Secondary | ICD-10-CM | POA: Diagnosis not present

## 2019-02-07 DIAGNOSIS — N139 Obstructive and reflux uropathy, unspecified: Secondary | ICD-10-CM | POA: Diagnosis not present

## 2019-02-07 DIAGNOSIS — C61 Malignant neoplasm of prostate: Secondary | ICD-10-CM | POA: Diagnosis not present

## 2019-02-07 DIAGNOSIS — E039 Hypothyroidism, unspecified: Secondary | ICD-10-CM | POA: Diagnosis not present

## 2019-02-07 DIAGNOSIS — C7951 Secondary malignant neoplasm of bone: Secondary | ICD-10-CM | POA: Diagnosis not present

## 2019-02-07 DIAGNOSIS — Z436 Encounter for attention to other artificial openings of urinary tract: Secondary | ICD-10-CM | POA: Diagnosis not present

## 2019-02-08 DIAGNOSIS — E039 Hypothyroidism, unspecified: Secondary | ICD-10-CM | POA: Diagnosis not present

## 2019-02-08 DIAGNOSIS — R590 Localized enlarged lymph nodes: Secondary | ICD-10-CM | POA: Diagnosis not present

## 2019-02-08 DIAGNOSIS — Z436 Encounter for attention to other artificial openings of urinary tract: Secondary | ICD-10-CM | POA: Diagnosis not present

## 2019-02-08 DIAGNOSIS — C7951 Secondary malignant neoplasm of bone: Secondary | ICD-10-CM | POA: Diagnosis not present

## 2019-02-08 DIAGNOSIS — C61 Malignant neoplasm of prostate: Secondary | ICD-10-CM | POA: Diagnosis not present

## 2019-02-08 DIAGNOSIS — N139 Obstructive and reflux uropathy, unspecified: Secondary | ICD-10-CM | POA: Diagnosis not present

## 2019-02-09 DIAGNOSIS — Z436 Encounter for attention to other artificial openings of urinary tract: Secondary | ICD-10-CM | POA: Diagnosis not present

## 2019-02-09 DIAGNOSIS — C61 Malignant neoplasm of prostate: Secondary | ICD-10-CM | POA: Diagnosis not present

## 2019-02-09 DIAGNOSIS — R590 Localized enlarged lymph nodes: Secondary | ICD-10-CM | POA: Diagnosis not present

## 2019-02-09 DIAGNOSIS — C7951 Secondary malignant neoplasm of bone: Secondary | ICD-10-CM | POA: Diagnosis not present

## 2019-02-09 DIAGNOSIS — N139 Obstructive and reflux uropathy, unspecified: Secondary | ICD-10-CM | POA: Diagnosis not present

## 2019-02-09 DIAGNOSIS — E039 Hypothyroidism, unspecified: Secondary | ICD-10-CM | POA: Diagnosis not present

## 2019-02-10 DIAGNOSIS — Z436 Encounter for attention to other artificial openings of urinary tract: Secondary | ICD-10-CM | POA: Diagnosis not present

## 2019-02-10 DIAGNOSIS — E039 Hypothyroidism, unspecified: Secondary | ICD-10-CM | POA: Diagnosis not present

## 2019-02-10 DIAGNOSIS — C7951 Secondary malignant neoplasm of bone: Secondary | ICD-10-CM | POA: Diagnosis not present

## 2019-02-10 DIAGNOSIS — N139 Obstructive and reflux uropathy, unspecified: Secondary | ICD-10-CM | POA: Diagnosis not present

## 2019-02-10 DIAGNOSIS — C61 Malignant neoplasm of prostate: Secondary | ICD-10-CM | POA: Diagnosis not present

## 2019-02-10 DIAGNOSIS — R590 Localized enlarged lymph nodes: Secondary | ICD-10-CM | POA: Diagnosis not present

## 2019-02-11 DIAGNOSIS — E039 Hypothyroidism, unspecified: Secondary | ICD-10-CM | POA: Diagnosis not present

## 2019-02-11 DIAGNOSIS — Z436 Encounter for attention to other artificial openings of urinary tract: Secondary | ICD-10-CM | POA: Diagnosis not present

## 2019-02-11 DIAGNOSIS — C7951 Secondary malignant neoplasm of bone: Secondary | ICD-10-CM | POA: Diagnosis not present

## 2019-02-11 DIAGNOSIS — R590 Localized enlarged lymph nodes: Secondary | ICD-10-CM | POA: Diagnosis not present

## 2019-02-11 DIAGNOSIS — C61 Malignant neoplasm of prostate: Secondary | ICD-10-CM | POA: Diagnosis not present

## 2019-02-11 DIAGNOSIS — N139 Obstructive and reflux uropathy, unspecified: Secondary | ICD-10-CM | POA: Diagnosis not present

## 2019-02-12 DIAGNOSIS — N139 Obstructive and reflux uropathy, unspecified: Secondary | ICD-10-CM | POA: Diagnosis not present

## 2019-02-12 DIAGNOSIS — C7951 Secondary malignant neoplasm of bone: Secondary | ICD-10-CM | POA: Diagnosis not present

## 2019-02-12 DIAGNOSIS — R590 Localized enlarged lymph nodes: Secondary | ICD-10-CM | POA: Diagnosis not present

## 2019-02-12 DIAGNOSIS — Z436 Encounter for attention to other artificial openings of urinary tract: Secondary | ICD-10-CM | POA: Diagnosis not present

## 2019-02-12 DIAGNOSIS — E039 Hypothyroidism, unspecified: Secondary | ICD-10-CM | POA: Diagnosis not present

## 2019-02-12 DIAGNOSIS — C61 Malignant neoplasm of prostate: Secondary | ICD-10-CM | POA: Diagnosis not present

## 2019-02-13 DIAGNOSIS — Z436 Encounter for attention to other artificial openings of urinary tract: Secondary | ICD-10-CM | POA: Diagnosis not present

## 2019-02-13 DIAGNOSIS — E039 Hypothyroidism, unspecified: Secondary | ICD-10-CM | POA: Diagnosis not present

## 2019-02-13 DIAGNOSIS — C7951 Secondary malignant neoplasm of bone: Secondary | ICD-10-CM | POA: Diagnosis not present

## 2019-02-13 DIAGNOSIS — N139 Obstructive and reflux uropathy, unspecified: Secondary | ICD-10-CM | POA: Diagnosis not present

## 2019-02-13 DIAGNOSIS — C61 Malignant neoplasm of prostate: Secondary | ICD-10-CM | POA: Diagnosis not present

## 2019-02-13 DIAGNOSIS — R590 Localized enlarged lymph nodes: Secondary | ICD-10-CM | POA: Diagnosis not present

## 2019-02-14 DIAGNOSIS — E039 Hypothyroidism, unspecified: Secondary | ICD-10-CM | POA: Diagnosis not present

## 2019-02-14 DIAGNOSIS — Z436 Encounter for attention to other artificial openings of urinary tract: Secondary | ICD-10-CM | POA: Diagnosis not present

## 2019-02-14 DIAGNOSIS — C7951 Secondary malignant neoplasm of bone: Secondary | ICD-10-CM | POA: Diagnosis not present

## 2019-02-14 DIAGNOSIS — N139 Obstructive and reflux uropathy, unspecified: Secondary | ICD-10-CM | POA: Diagnosis not present

## 2019-02-14 DIAGNOSIS — R590 Localized enlarged lymph nodes: Secondary | ICD-10-CM | POA: Diagnosis not present

## 2019-02-14 DIAGNOSIS — C61 Malignant neoplasm of prostate: Secondary | ICD-10-CM | POA: Diagnosis not present

## 2019-02-15 DIAGNOSIS — E039 Hypothyroidism, unspecified: Secondary | ICD-10-CM | POA: Diagnosis not present

## 2019-02-15 DIAGNOSIS — R590 Localized enlarged lymph nodes: Secondary | ICD-10-CM | POA: Diagnosis not present

## 2019-02-15 DIAGNOSIS — C61 Malignant neoplasm of prostate: Secondary | ICD-10-CM | POA: Diagnosis not present

## 2019-02-15 DIAGNOSIS — Z436 Encounter for attention to other artificial openings of urinary tract: Secondary | ICD-10-CM | POA: Diagnosis not present

## 2019-02-15 DIAGNOSIS — N139 Obstructive and reflux uropathy, unspecified: Secondary | ICD-10-CM | POA: Diagnosis not present

## 2019-02-15 DIAGNOSIS — C7951 Secondary malignant neoplasm of bone: Secondary | ICD-10-CM | POA: Diagnosis not present

## 2019-02-16 DIAGNOSIS — C7951 Secondary malignant neoplasm of bone: Secondary | ICD-10-CM | POA: Diagnosis not present

## 2019-02-16 DIAGNOSIS — C61 Malignant neoplasm of prostate: Secondary | ICD-10-CM | POA: Diagnosis not present

## 2019-02-16 DIAGNOSIS — R590 Localized enlarged lymph nodes: Secondary | ICD-10-CM | POA: Diagnosis not present

## 2019-02-16 DIAGNOSIS — N139 Obstructive and reflux uropathy, unspecified: Secondary | ICD-10-CM | POA: Diagnosis not present

## 2019-02-16 DIAGNOSIS — E039 Hypothyroidism, unspecified: Secondary | ICD-10-CM | POA: Diagnosis not present

## 2019-02-16 DIAGNOSIS — Z436 Encounter for attention to other artificial openings of urinary tract: Secondary | ICD-10-CM | POA: Diagnosis not present

## 2019-02-17 DIAGNOSIS — Z436 Encounter for attention to other artificial openings of urinary tract: Secondary | ICD-10-CM | POA: Diagnosis not present

## 2019-02-17 DIAGNOSIS — C7951 Secondary malignant neoplasm of bone: Secondary | ICD-10-CM | POA: Diagnosis not present

## 2019-02-17 DIAGNOSIS — E039 Hypothyroidism, unspecified: Secondary | ICD-10-CM | POA: Diagnosis not present

## 2019-02-17 DIAGNOSIS — C61 Malignant neoplasm of prostate: Secondary | ICD-10-CM | POA: Diagnosis not present

## 2019-02-17 DIAGNOSIS — R590 Localized enlarged lymph nodes: Secondary | ICD-10-CM | POA: Diagnosis not present

## 2019-02-17 DIAGNOSIS — N139 Obstructive and reflux uropathy, unspecified: Secondary | ICD-10-CM | POA: Diagnosis not present

## 2019-02-18 DIAGNOSIS — Z436 Encounter for attention to other artificial openings of urinary tract: Secondary | ICD-10-CM | POA: Diagnosis not present

## 2019-02-18 DIAGNOSIS — N139 Obstructive and reflux uropathy, unspecified: Secondary | ICD-10-CM | POA: Diagnosis not present

## 2019-02-18 DIAGNOSIS — R590 Localized enlarged lymph nodes: Secondary | ICD-10-CM | POA: Diagnosis not present

## 2019-02-18 DIAGNOSIS — C61 Malignant neoplasm of prostate: Secondary | ICD-10-CM | POA: Diagnosis not present

## 2019-02-18 DIAGNOSIS — C7951 Secondary malignant neoplasm of bone: Secondary | ICD-10-CM | POA: Diagnosis not present

## 2019-02-18 DIAGNOSIS — E039 Hypothyroidism, unspecified: Secondary | ICD-10-CM | POA: Diagnosis not present

## 2019-02-19 DIAGNOSIS — C7951 Secondary malignant neoplasm of bone: Secondary | ICD-10-CM | POA: Diagnosis not present

## 2019-02-19 DIAGNOSIS — Z436 Encounter for attention to other artificial openings of urinary tract: Secondary | ICD-10-CM | POA: Diagnosis not present

## 2019-02-19 DIAGNOSIS — E039 Hypothyroidism, unspecified: Secondary | ICD-10-CM | POA: Diagnosis not present

## 2019-02-19 DIAGNOSIS — N139 Obstructive and reflux uropathy, unspecified: Secondary | ICD-10-CM | POA: Diagnosis not present

## 2019-02-19 DIAGNOSIS — R590 Localized enlarged lymph nodes: Secondary | ICD-10-CM | POA: Diagnosis not present

## 2019-02-19 DIAGNOSIS — C61 Malignant neoplasm of prostate: Secondary | ICD-10-CM | POA: Diagnosis not present

## 2019-02-20 DIAGNOSIS — N139 Obstructive and reflux uropathy, unspecified: Secondary | ICD-10-CM | POA: Diagnosis not present

## 2019-02-20 DIAGNOSIS — C61 Malignant neoplasm of prostate: Secondary | ICD-10-CM | POA: Diagnosis not present

## 2019-02-20 DIAGNOSIS — E039 Hypothyroidism, unspecified: Secondary | ICD-10-CM | POA: Diagnosis not present

## 2019-02-20 DIAGNOSIS — C7951 Secondary malignant neoplasm of bone: Secondary | ICD-10-CM | POA: Diagnosis not present

## 2019-02-20 DIAGNOSIS — Z436 Encounter for attention to other artificial openings of urinary tract: Secondary | ICD-10-CM | POA: Diagnosis not present

## 2019-02-20 DIAGNOSIS — R590 Localized enlarged lymph nodes: Secondary | ICD-10-CM | POA: Diagnosis not present

## 2019-02-21 DIAGNOSIS — C7951 Secondary malignant neoplasm of bone: Secondary | ICD-10-CM | POA: Diagnosis not present

## 2019-02-21 DIAGNOSIS — R590 Localized enlarged lymph nodes: Secondary | ICD-10-CM | POA: Diagnosis not present

## 2019-02-21 DIAGNOSIS — E039 Hypothyroidism, unspecified: Secondary | ICD-10-CM | POA: Diagnosis not present

## 2019-02-21 DIAGNOSIS — N139 Obstructive and reflux uropathy, unspecified: Secondary | ICD-10-CM | POA: Diagnosis not present

## 2019-02-21 DIAGNOSIS — Z436 Encounter for attention to other artificial openings of urinary tract: Secondary | ICD-10-CM | POA: Diagnosis not present

## 2019-02-21 DIAGNOSIS — C61 Malignant neoplasm of prostate: Secondary | ICD-10-CM | POA: Diagnosis not present

## 2019-02-22 DIAGNOSIS — E039 Hypothyroidism, unspecified: Secondary | ICD-10-CM | POA: Diagnosis not present

## 2019-02-22 DIAGNOSIS — Z436 Encounter for attention to other artificial openings of urinary tract: Secondary | ICD-10-CM | POA: Diagnosis not present

## 2019-02-22 DIAGNOSIS — N139 Obstructive and reflux uropathy, unspecified: Secondary | ICD-10-CM | POA: Diagnosis not present

## 2019-02-22 DIAGNOSIS — Z4682 Encounter for fitting and adjustment of non-vascular catheter: Secondary | ICD-10-CM | POA: Diagnosis not present

## 2019-02-22 DIAGNOSIS — R590 Localized enlarged lymph nodes: Secondary | ICD-10-CM | POA: Diagnosis not present

## 2019-02-22 DIAGNOSIS — C7951 Secondary malignant neoplasm of bone: Secondary | ICD-10-CM | POA: Diagnosis not present

## 2019-02-22 DIAGNOSIS — C61 Malignant neoplasm of prostate: Secondary | ICD-10-CM | POA: Diagnosis not present

## 2019-02-22 DIAGNOSIS — N135 Crossing vessel and stricture of ureter without hydronephrosis: Secondary | ICD-10-CM | POA: Diagnosis not present

## 2019-02-23 DIAGNOSIS — E039 Hypothyroidism, unspecified: Secondary | ICD-10-CM | POA: Diagnosis not present

## 2019-02-23 DIAGNOSIS — C61 Malignant neoplasm of prostate: Secondary | ICD-10-CM | POA: Diagnosis not present

## 2019-02-23 DIAGNOSIS — N139 Obstructive and reflux uropathy, unspecified: Secondary | ICD-10-CM | POA: Diagnosis not present

## 2019-02-23 DIAGNOSIS — Z436 Encounter for attention to other artificial openings of urinary tract: Secondary | ICD-10-CM | POA: Diagnosis not present

## 2019-02-23 DIAGNOSIS — R590 Localized enlarged lymph nodes: Secondary | ICD-10-CM | POA: Diagnosis not present

## 2019-02-23 DIAGNOSIS — C7951 Secondary malignant neoplasm of bone: Secondary | ICD-10-CM | POA: Diagnosis not present

## 2019-02-24 DIAGNOSIS — Z436 Encounter for attention to other artificial openings of urinary tract: Secondary | ICD-10-CM | POA: Diagnosis not present

## 2019-02-24 DIAGNOSIS — N139 Obstructive and reflux uropathy, unspecified: Secondary | ICD-10-CM | POA: Diagnosis not present

## 2019-02-24 DIAGNOSIS — E039 Hypothyroidism, unspecified: Secondary | ICD-10-CM | POA: Diagnosis not present

## 2019-02-24 DIAGNOSIS — C61 Malignant neoplasm of prostate: Secondary | ICD-10-CM | POA: Diagnosis not present

## 2019-02-24 DIAGNOSIS — R590 Localized enlarged lymph nodes: Secondary | ICD-10-CM | POA: Diagnosis not present

## 2019-02-24 DIAGNOSIS — C7951 Secondary malignant neoplasm of bone: Secondary | ICD-10-CM | POA: Diagnosis not present

## 2019-02-25 DIAGNOSIS — C7951 Secondary malignant neoplasm of bone: Secondary | ICD-10-CM | POA: Diagnosis not present

## 2019-02-25 DIAGNOSIS — R590 Localized enlarged lymph nodes: Secondary | ICD-10-CM | POA: Diagnosis not present

## 2019-02-25 DIAGNOSIS — Z436 Encounter for attention to other artificial openings of urinary tract: Secondary | ICD-10-CM | POA: Diagnosis not present

## 2019-02-25 DIAGNOSIS — N139 Obstructive and reflux uropathy, unspecified: Secondary | ICD-10-CM | POA: Diagnosis not present

## 2019-02-25 DIAGNOSIS — E039 Hypothyroidism, unspecified: Secondary | ICD-10-CM | POA: Diagnosis not present

## 2019-02-25 DIAGNOSIS — C61 Malignant neoplasm of prostate: Secondary | ICD-10-CM | POA: Diagnosis not present

## 2019-02-26 DIAGNOSIS — C7951 Secondary malignant neoplasm of bone: Secondary | ICD-10-CM | POA: Diagnosis not present

## 2019-02-26 DIAGNOSIS — C61 Malignant neoplasm of prostate: Secondary | ICD-10-CM | POA: Diagnosis not present

## 2019-02-26 DIAGNOSIS — N139 Obstructive and reflux uropathy, unspecified: Secondary | ICD-10-CM | POA: Diagnosis not present

## 2019-02-26 DIAGNOSIS — Z436 Encounter for attention to other artificial openings of urinary tract: Secondary | ICD-10-CM | POA: Diagnosis not present

## 2019-02-26 DIAGNOSIS — R590 Localized enlarged lymph nodes: Secondary | ICD-10-CM | POA: Diagnosis not present

## 2019-02-26 DIAGNOSIS — E039 Hypothyroidism, unspecified: Secondary | ICD-10-CM | POA: Diagnosis not present

## 2019-02-27 DIAGNOSIS — C7951 Secondary malignant neoplasm of bone: Secondary | ICD-10-CM | POA: Diagnosis not present

## 2019-02-27 DIAGNOSIS — Z436 Encounter for attention to other artificial openings of urinary tract: Secondary | ICD-10-CM | POA: Diagnosis not present

## 2019-02-27 DIAGNOSIS — C61 Malignant neoplasm of prostate: Secondary | ICD-10-CM | POA: Diagnosis not present

## 2019-02-27 DIAGNOSIS — R590 Localized enlarged lymph nodes: Secondary | ICD-10-CM | POA: Diagnosis not present

## 2019-02-27 DIAGNOSIS — E039 Hypothyroidism, unspecified: Secondary | ICD-10-CM | POA: Diagnosis not present

## 2019-02-27 DIAGNOSIS — N139 Obstructive and reflux uropathy, unspecified: Secondary | ICD-10-CM | POA: Diagnosis not present

## 2019-02-28 DIAGNOSIS — E039 Hypothyroidism, unspecified: Secondary | ICD-10-CM | POA: Diagnosis not present

## 2019-02-28 DIAGNOSIS — Z436 Encounter for attention to other artificial openings of urinary tract: Secondary | ICD-10-CM | POA: Diagnosis not present

## 2019-02-28 DIAGNOSIS — R590 Localized enlarged lymph nodes: Secondary | ICD-10-CM | POA: Diagnosis not present

## 2019-02-28 DIAGNOSIS — C7951 Secondary malignant neoplasm of bone: Secondary | ICD-10-CM | POA: Diagnosis not present

## 2019-02-28 DIAGNOSIS — N139 Obstructive and reflux uropathy, unspecified: Secondary | ICD-10-CM | POA: Diagnosis not present

## 2019-02-28 DIAGNOSIS — C61 Malignant neoplasm of prostate: Secondary | ICD-10-CM | POA: Diagnosis not present

## 2019-03-01 DIAGNOSIS — N139 Obstructive and reflux uropathy, unspecified: Secondary | ICD-10-CM | POA: Diagnosis not present

## 2019-03-01 DIAGNOSIS — R590 Localized enlarged lymph nodes: Secondary | ICD-10-CM | POA: Diagnosis not present

## 2019-03-01 DIAGNOSIS — C61 Malignant neoplasm of prostate: Secondary | ICD-10-CM | POA: Diagnosis not present

## 2019-03-01 DIAGNOSIS — Z436 Encounter for attention to other artificial openings of urinary tract: Secondary | ICD-10-CM | POA: Diagnosis not present

## 2019-03-01 DIAGNOSIS — C7951 Secondary malignant neoplasm of bone: Secondary | ICD-10-CM | POA: Diagnosis not present

## 2019-03-01 DIAGNOSIS — E039 Hypothyroidism, unspecified: Secondary | ICD-10-CM | POA: Diagnosis not present

## 2019-03-02 DIAGNOSIS — R590 Localized enlarged lymph nodes: Secondary | ICD-10-CM | POA: Diagnosis not present

## 2019-03-02 DIAGNOSIS — E039 Hypothyroidism, unspecified: Secondary | ICD-10-CM | POA: Diagnosis not present

## 2019-03-02 DIAGNOSIS — C7951 Secondary malignant neoplasm of bone: Secondary | ICD-10-CM | POA: Diagnosis not present

## 2019-03-02 DIAGNOSIS — C61 Malignant neoplasm of prostate: Secondary | ICD-10-CM | POA: Diagnosis not present

## 2019-03-02 DIAGNOSIS — Z436 Encounter for attention to other artificial openings of urinary tract: Secondary | ICD-10-CM | POA: Diagnosis not present

## 2019-03-02 DIAGNOSIS — N139 Obstructive and reflux uropathy, unspecified: Secondary | ICD-10-CM | POA: Diagnosis not present

## 2019-03-03 DIAGNOSIS — C7951 Secondary malignant neoplasm of bone: Secondary | ICD-10-CM | POA: Diagnosis not present

## 2019-03-03 DIAGNOSIS — E039 Hypothyroidism, unspecified: Secondary | ICD-10-CM | POA: Diagnosis not present

## 2019-03-03 DIAGNOSIS — Z436 Encounter for attention to other artificial openings of urinary tract: Secondary | ICD-10-CM | POA: Diagnosis not present

## 2019-03-03 DIAGNOSIS — C61 Malignant neoplasm of prostate: Secondary | ICD-10-CM | POA: Diagnosis not present

## 2019-03-03 DIAGNOSIS — N139 Obstructive and reflux uropathy, unspecified: Secondary | ICD-10-CM | POA: Diagnosis not present

## 2019-03-03 DIAGNOSIS — R590 Localized enlarged lymph nodes: Secondary | ICD-10-CM | POA: Diagnosis not present

## 2019-03-04 DIAGNOSIS — Z436 Encounter for attention to other artificial openings of urinary tract: Secondary | ICD-10-CM | POA: Diagnosis not present

## 2019-03-04 DIAGNOSIS — E039 Hypothyroidism, unspecified: Secondary | ICD-10-CM | POA: Diagnosis not present

## 2019-03-04 DIAGNOSIS — R590 Localized enlarged lymph nodes: Secondary | ICD-10-CM | POA: Diagnosis not present

## 2019-03-04 DIAGNOSIS — C7951 Secondary malignant neoplasm of bone: Secondary | ICD-10-CM | POA: Diagnosis not present

## 2019-03-04 DIAGNOSIS — N139 Obstructive and reflux uropathy, unspecified: Secondary | ICD-10-CM | POA: Diagnosis not present

## 2019-03-04 DIAGNOSIS — C61 Malignant neoplasm of prostate: Secondary | ICD-10-CM | POA: Diagnosis not present

## 2019-03-05 DIAGNOSIS — C61 Malignant neoplasm of prostate: Secondary | ICD-10-CM | POA: Diagnosis not present

## 2019-03-05 DIAGNOSIS — R590 Localized enlarged lymph nodes: Secondary | ICD-10-CM | POA: Diagnosis not present

## 2019-03-05 DIAGNOSIS — N139 Obstructive and reflux uropathy, unspecified: Secondary | ICD-10-CM | POA: Diagnosis not present

## 2019-03-05 DIAGNOSIS — E039 Hypothyroidism, unspecified: Secondary | ICD-10-CM | POA: Diagnosis not present

## 2019-03-05 DIAGNOSIS — C7951 Secondary malignant neoplasm of bone: Secondary | ICD-10-CM | POA: Diagnosis not present

## 2019-03-05 DIAGNOSIS — Z436 Encounter for attention to other artificial openings of urinary tract: Secondary | ICD-10-CM | POA: Diagnosis not present

## 2019-03-06 DIAGNOSIS — C7951 Secondary malignant neoplasm of bone: Secondary | ICD-10-CM | POA: Diagnosis not present

## 2019-03-06 DIAGNOSIS — E039 Hypothyroidism, unspecified: Secondary | ICD-10-CM | POA: Diagnosis not present

## 2019-03-06 DIAGNOSIS — R590 Localized enlarged lymph nodes: Secondary | ICD-10-CM | POA: Diagnosis not present

## 2019-03-06 DIAGNOSIS — N139 Obstructive and reflux uropathy, unspecified: Secondary | ICD-10-CM | POA: Diagnosis not present

## 2019-03-06 DIAGNOSIS — C61 Malignant neoplasm of prostate: Secondary | ICD-10-CM | POA: Diagnosis not present

## 2019-03-06 DIAGNOSIS — Z436 Encounter for attention to other artificial openings of urinary tract: Secondary | ICD-10-CM | POA: Diagnosis not present

## 2019-03-07 DIAGNOSIS — R590 Localized enlarged lymph nodes: Secondary | ICD-10-CM | POA: Diagnosis not present

## 2019-03-07 DIAGNOSIS — C61 Malignant neoplasm of prostate: Secondary | ICD-10-CM | POA: Diagnosis not present

## 2019-03-07 DIAGNOSIS — N139 Obstructive and reflux uropathy, unspecified: Secondary | ICD-10-CM | POA: Diagnosis not present

## 2019-03-07 DIAGNOSIS — E039 Hypothyroidism, unspecified: Secondary | ICD-10-CM | POA: Diagnosis not present

## 2019-03-07 DIAGNOSIS — C7951 Secondary malignant neoplasm of bone: Secondary | ICD-10-CM | POA: Diagnosis not present

## 2019-03-07 DIAGNOSIS — Z436 Encounter for attention to other artificial openings of urinary tract: Secondary | ICD-10-CM | POA: Diagnosis not present

## 2019-03-08 DIAGNOSIS — C7951 Secondary malignant neoplasm of bone: Secondary | ICD-10-CM | POA: Diagnosis not present

## 2019-03-08 DIAGNOSIS — R590 Localized enlarged lymph nodes: Secondary | ICD-10-CM | POA: Diagnosis not present

## 2019-03-08 DIAGNOSIS — E039 Hypothyroidism, unspecified: Secondary | ICD-10-CM | POA: Diagnosis not present

## 2019-03-08 DIAGNOSIS — Z436 Encounter for attention to other artificial openings of urinary tract: Secondary | ICD-10-CM | POA: Diagnosis not present

## 2019-03-08 DIAGNOSIS — C61 Malignant neoplasm of prostate: Secondary | ICD-10-CM | POA: Diagnosis not present

## 2019-03-08 DIAGNOSIS — N139 Obstructive and reflux uropathy, unspecified: Secondary | ICD-10-CM | POA: Diagnosis not present

## 2019-03-09 DIAGNOSIS — C61 Malignant neoplasm of prostate: Secondary | ICD-10-CM | POA: Diagnosis not present

## 2019-03-09 DIAGNOSIS — C7951 Secondary malignant neoplasm of bone: Secondary | ICD-10-CM | POA: Diagnosis not present

## 2019-03-09 DIAGNOSIS — Z436 Encounter for attention to other artificial openings of urinary tract: Secondary | ICD-10-CM | POA: Diagnosis not present

## 2019-03-09 DIAGNOSIS — R590 Localized enlarged lymph nodes: Secondary | ICD-10-CM | POA: Diagnosis not present

## 2019-03-09 DIAGNOSIS — E039 Hypothyroidism, unspecified: Secondary | ICD-10-CM | POA: Diagnosis not present

## 2019-03-09 DIAGNOSIS — N139 Obstructive and reflux uropathy, unspecified: Secondary | ICD-10-CM | POA: Diagnosis not present

## 2019-03-10 DIAGNOSIS — C61 Malignant neoplasm of prostate: Secondary | ICD-10-CM | POA: Diagnosis not present

## 2019-03-10 DIAGNOSIS — E039 Hypothyroidism, unspecified: Secondary | ICD-10-CM | POA: Diagnosis not present

## 2019-03-10 DIAGNOSIS — R590 Localized enlarged lymph nodes: Secondary | ICD-10-CM | POA: Diagnosis not present

## 2019-03-10 DIAGNOSIS — N139 Obstructive and reflux uropathy, unspecified: Secondary | ICD-10-CM | POA: Diagnosis not present

## 2019-03-10 DIAGNOSIS — C7951 Secondary malignant neoplasm of bone: Secondary | ICD-10-CM | POA: Diagnosis not present

## 2019-03-10 DIAGNOSIS — Z436 Encounter for attention to other artificial openings of urinary tract: Secondary | ICD-10-CM | POA: Diagnosis not present

## 2019-03-11 DIAGNOSIS — N139 Obstructive and reflux uropathy, unspecified: Secondary | ICD-10-CM | POA: Diagnosis not present

## 2019-03-11 DIAGNOSIS — E039 Hypothyroidism, unspecified: Secondary | ICD-10-CM | POA: Diagnosis not present

## 2019-03-11 DIAGNOSIS — R590 Localized enlarged lymph nodes: Secondary | ICD-10-CM | POA: Diagnosis not present

## 2019-03-11 DIAGNOSIS — C61 Malignant neoplasm of prostate: Secondary | ICD-10-CM | POA: Diagnosis not present

## 2019-03-11 DIAGNOSIS — Z436 Encounter for attention to other artificial openings of urinary tract: Secondary | ICD-10-CM | POA: Diagnosis not present

## 2019-03-11 DIAGNOSIS — C7951 Secondary malignant neoplasm of bone: Secondary | ICD-10-CM | POA: Diagnosis not present

## 2019-03-14 ENCOUNTER — Telehealth: Payer: Self-pay | Admitting: *Deleted

## 2019-03-14 NOTE — Telephone Encounter (Signed)
Received fax from Hospice stating [name] died on March 12, 2019

## 2019-03-23 DEATH — deceased

## 2019-03-24 ENCOUNTER — Telehealth: Payer: Self-pay

## 2019-03-24 NOTE — Telephone Encounter (Signed)
Patient's wife in the lobby of the [facility] requesting to speak to Dr. Hazeline Junker nurse. Out to lobby to speak to her and she hands nurse 3 copies of patient's death certificate asking for a time to be added to the death certificate. Informed her that the death certificate is official and has been signed by MD and nothing can be added. Spouse obtained copies from [place] and the copies have a seal. Spouse stated she needs time of death on certificate because her sister-in-law withdrew all of the money from patient's account on the day of his death and she needs the money to pay for funeral expenses.  Spouse also stated that sister-in -law was power of attorney. Informed her that certificate has been signed and is official. Spouse then called Hospice and [facility] and asked for the nurses name who pronounced  patient. Nurse who answered phone at [facility] gave spouse the name of the nurse who pronounced patient. Spouse then said "well I am going to drive over there because somebody needs to put a time of death on the certificate. Nurse at [facility] stated the nurse who pronounce the patient works at night but she will see what she can do and call the spouse back. Spouse then left the [facility].

## 2019-03-31 ENCOUNTER — Telehealth: Payer: Self-pay | Admitting: Medical Oncology

## 2019-03-31 NOTE — Telephone Encounter (Signed)
Death certificate -Wife stopped by and said she needs "corrected death certificate with time on it" . See Natalies note. I told her to call [employer] and public records.   Please call her at [phone].

## 2019-04-05 ENCOUNTER — Telehealth: Payer: Self-pay

## 2019-04-05 NOTE — Telephone Encounter (Signed)
Was asked to speak with patient spouse in the [facility] lobby. Spouse, Dilva, stated that she needed the time of death on the death certificate due to legal issues and she has a Chief Executive Officer involved. Discussed that the death certificate has already been signed and filed with register of deeds therefor a new death certificate cannot be created. At this time Dilva called [employer] on her personal cell and got Estill Bamberg on the phone. Estill Bamberg stated that this is in fact an unusual case but due to legal issues the time of death has to be placed on the death certificate. She stated that the Health Dept has been contacted and that they are sending out a "new" original that will then be filed. Received the "new" original death certificate in which Dr. Alen Blew signed and this document was then handed back to the representative for [employer] for return delivery.

## 2019-06-18 IMAGING — CR DG CHEST 2V
2 series · 2 of 2 positions shown · non-contrast
Comparison: PET-CT 11/06/2016.

CLINICAL DATA: Cough for 2 months.  History of prostate cancer.

EXAM:
CHEST  2 VIEW

[w chest pa]
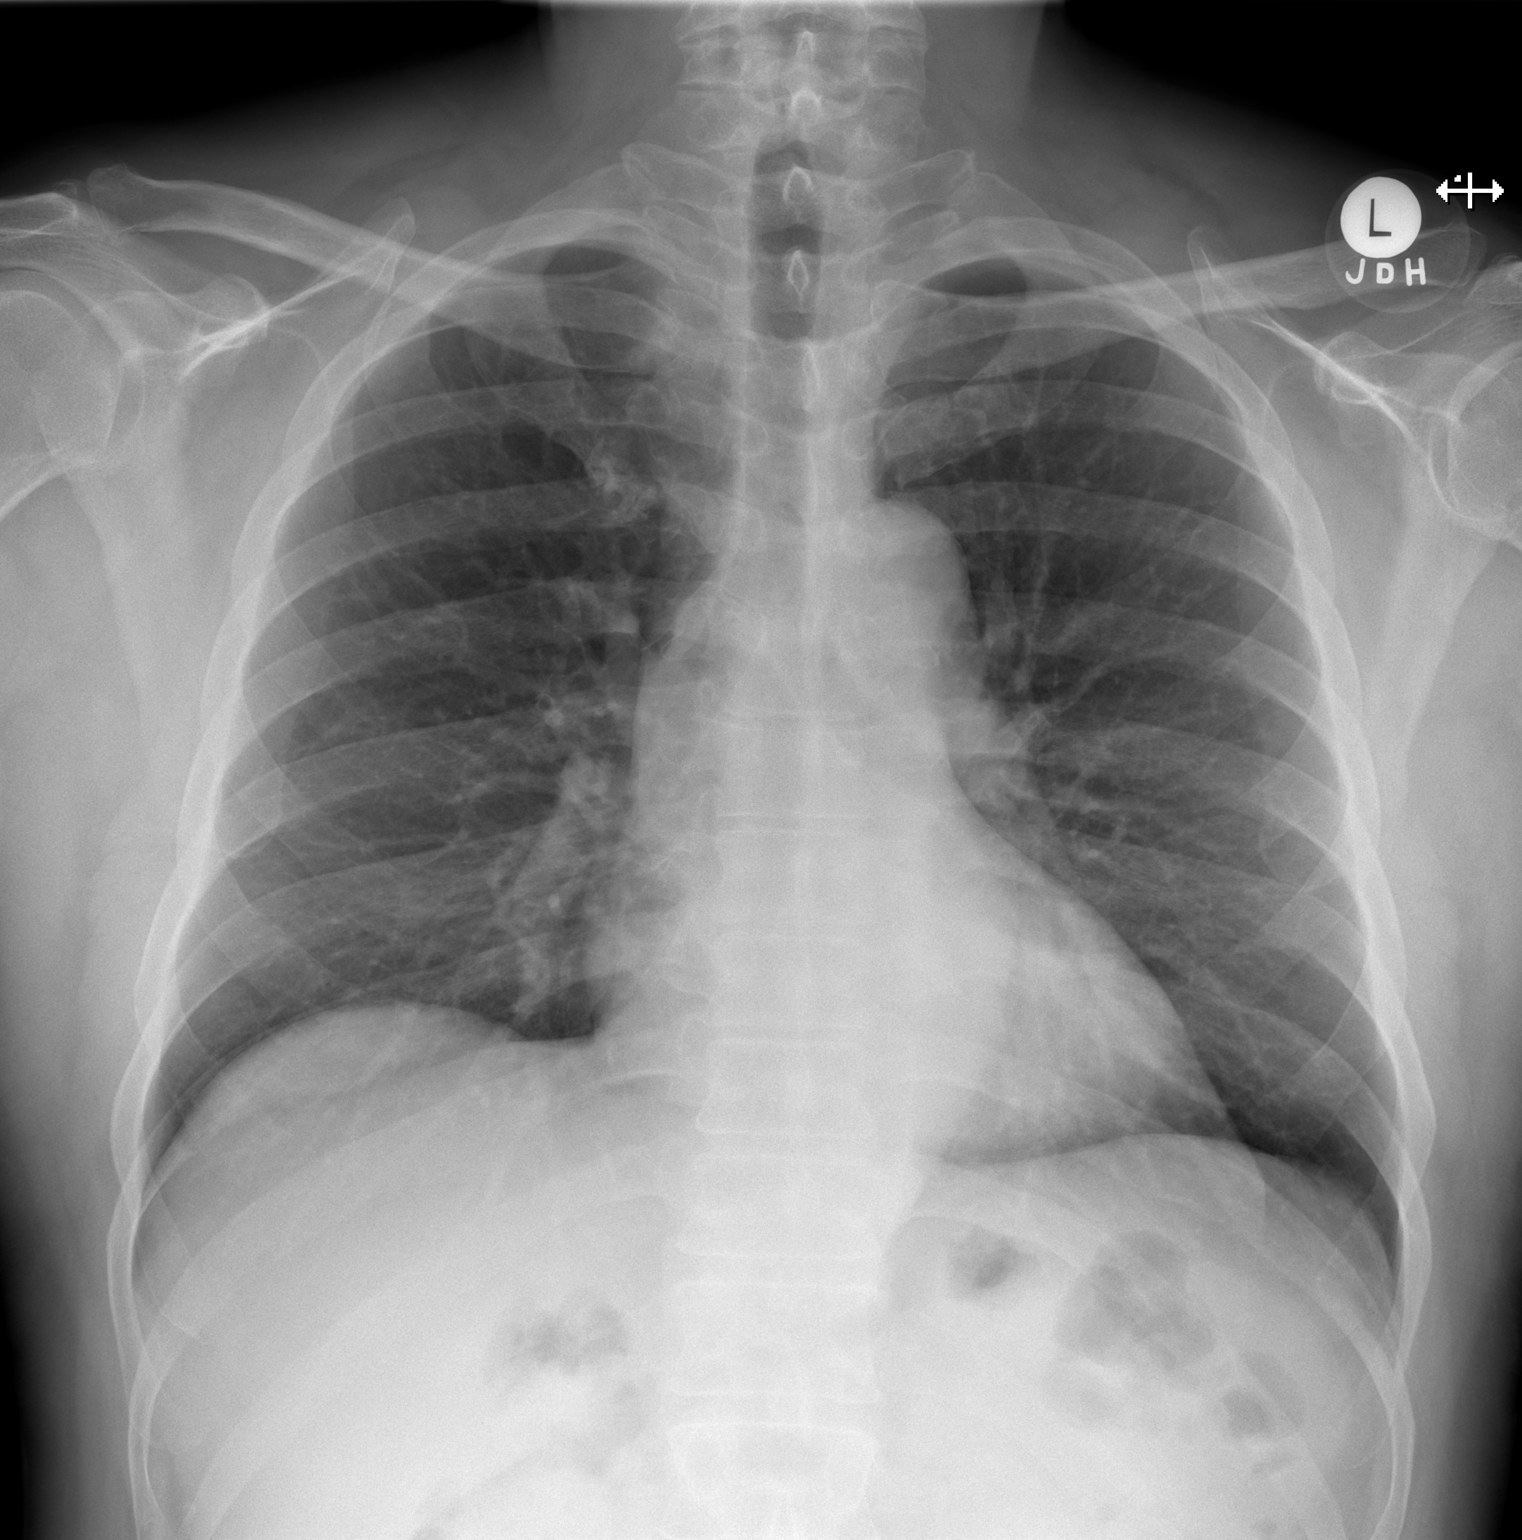

[w chest lat]
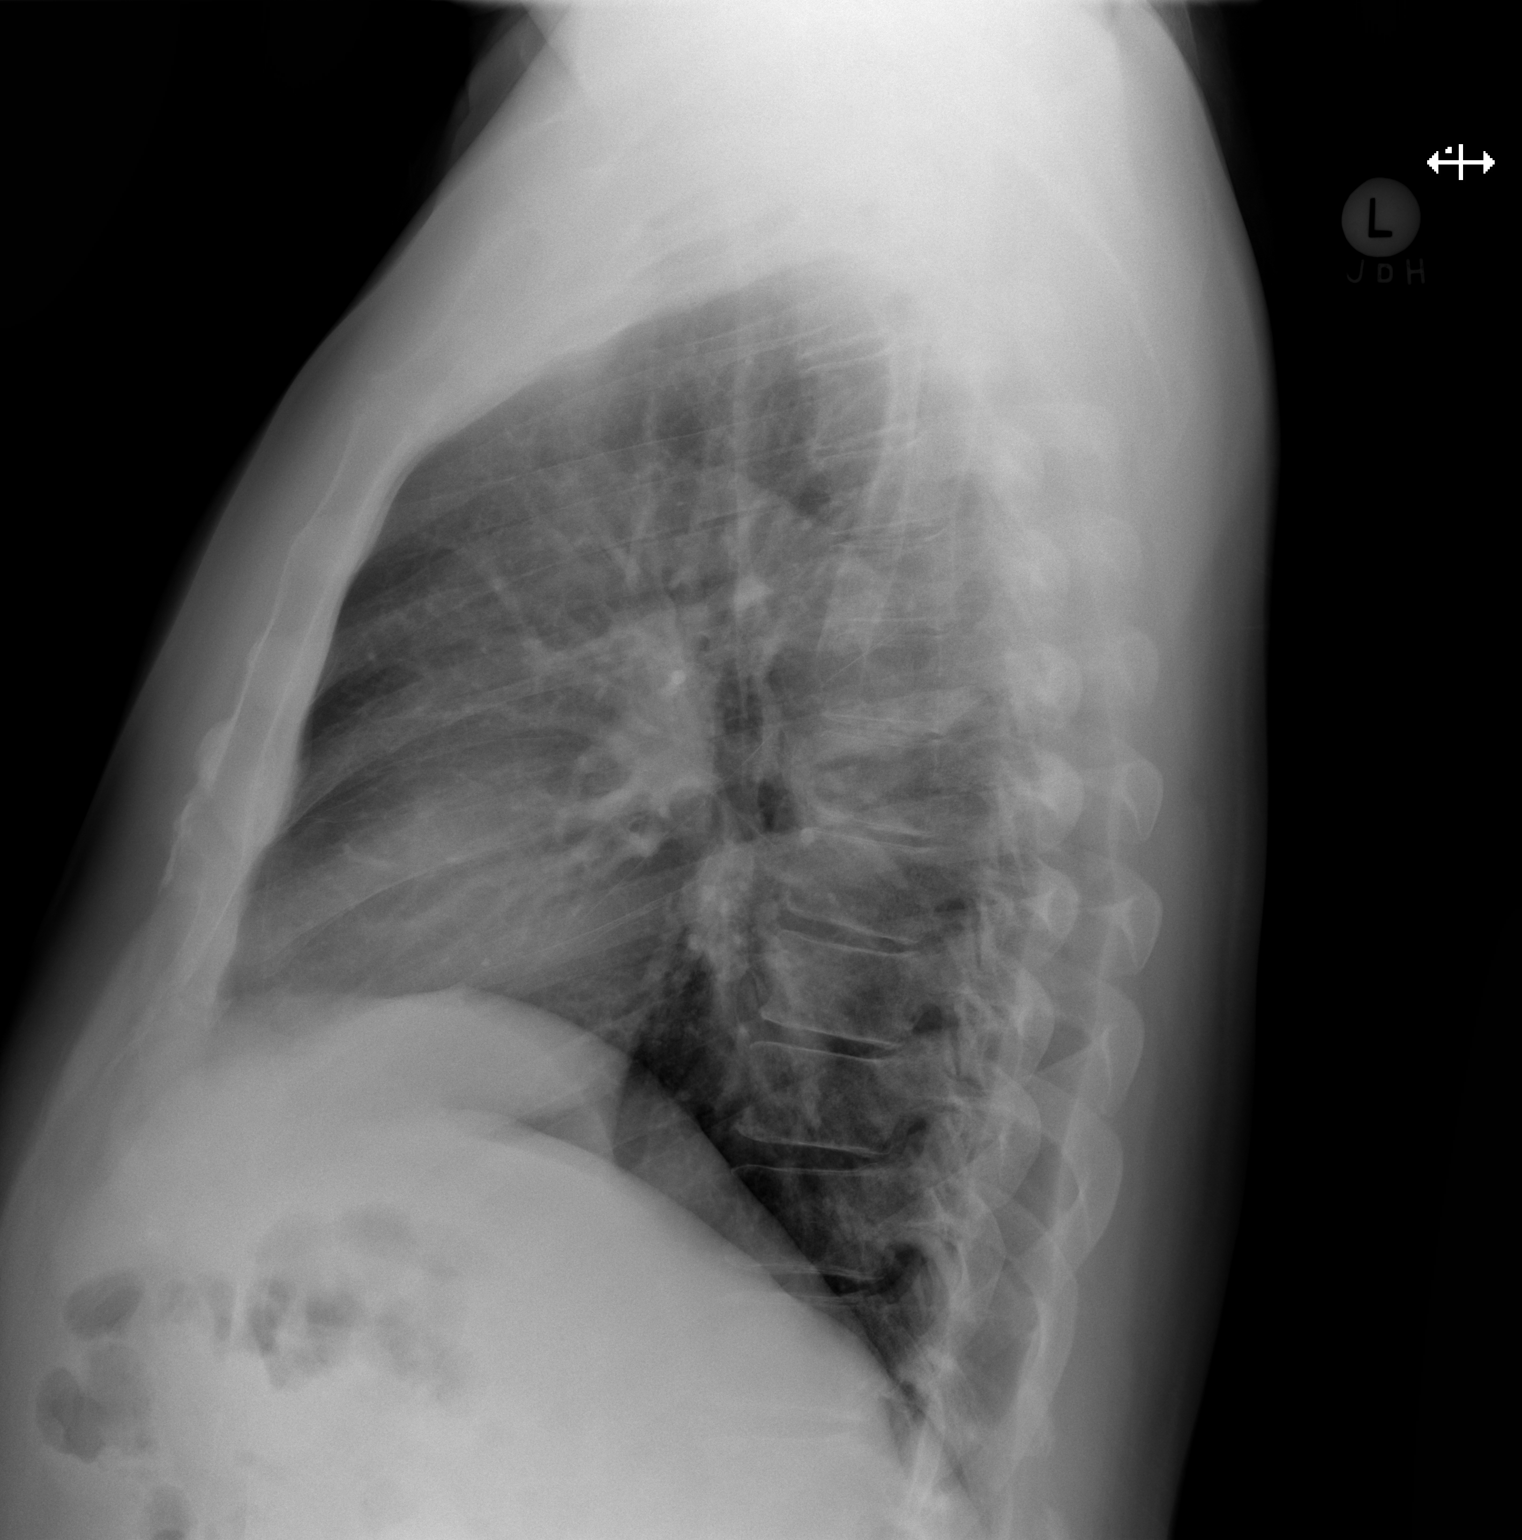

[2 of 2 positions shown; findings below may reference images not displayed]

FINDINGS: Mediastinum hilar structures normal. Lungs are clear. No pleural
effusion or pneumothorax. No acute bony abnormality identified.
Degenerative changes thoracic spine.
IMPRESSION: No active cardiopulmonary disease.
